# Patient Record
Sex: Female | Born: 1950 | Hispanic: No | Marital: Married | Smoking: Never smoker
Health system: Southern US, Community
[De-identification: ages and names within clinical notes are randomized; demographics above are authoritative.]

## PROBLEM LIST (undated history)

## (undated) DIAGNOSIS — E119 Type 2 diabetes mellitus without complications: Secondary | ICD-10-CM

## (undated) DIAGNOSIS — I5022 Chronic systolic (congestive) heart failure: Secondary | ICD-10-CM

## (undated) DIAGNOSIS — E079 Disorder of thyroid, unspecified: Secondary | ICD-10-CM

## (undated) DIAGNOSIS — I255 Ischemic cardiomyopathy: Secondary | ICD-10-CM

## (undated) DIAGNOSIS — E785 Hyperlipidemia, unspecified: Secondary | ICD-10-CM

## (undated) DIAGNOSIS — I2510 Atherosclerotic heart disease of native coronary artery without angina pectoris: Secondary | ICD-10-CM

## (undated) DIAGNOSIS — I10 Essential (primary) hypertension: Secondary | ICD-10-CM

## (undated) DIAGNOSIS — I219 Acute myocardial infarction, unspecified: Secondary | ICD-10-CM

## (undated) HISTORY — DX: Hyperlipidemia, unspecified: E78.5

## (undated) HISTORY — DX: Disorder of thyroid, unspecified: E07.9

## (undated) HISTORY — DX: Atherosclerotic heart disease of native coronary artery without angina pectoris: I25.10

## (undated) HISTORY — PX: CARDIAC SURGERY: SHX584

## (undated) HISTORY — DX: Chronic systolic (congestive) heart failure: I50.22

## (undated) HISTORY — PX: CATARACT EXTRACTION, BILATERAL: SHX1313

## (undated) HISTORY — PX: HIP FRACTURE SURGERY: SHX118

## (undated) HISTORY — PX: CORONARY ANGIOPLASTY WITH STENT PLACEMENT: SHX49

## (undated) HISTORY — DX: Ischemic cardiomyopathy: I25.5

---

## 2016-11-28 ENCOUNTER — Emergency Department (HOSPITAL_COMMUNITY)
Admission: EM | Admit: 2016-11-28 | Discharge: 2016-11-28 | Disposition: A | Discharge status: Home / Self Care | Payer: Self-pay | Attending: Emergency Medicine | Admitting: Emergency Medicine

## 2016-11-28 ENCOUNTER — Encounter (HOSPITAL_COMMUNITY): Payer: Self-pay | Admitting: *Deleted

## 2016-11-28 ENCOUNTER — Emergency Department (HOSPITAL_COMMUNITY): Payer: Self-pay

## 2016-11-28 DIAGNOSIS — Z955 Presence of coronary angioplasty implant and graft: Secondary | ICD-10-CM | POA: Insufficient documentation

## 2016-11-28 DIAGNOSIS — Z7902 Long term (current) use of antithrombotics/antiplatelets: Secondary | ICD-10-CM | POA: Insufficient documentation

## 2016-11-28 DIAGNOSIS — R0602 Shortness of breath: Secondary | ICD-10-CM | POA: Insufficient documentation

## 2016-11-28 DIAGNOSIS — I5020 Unspecified systolic (congestive) heart failure: Secondary | ICD-10-CM | POA: Insufficient documentation

## 2016-11-28 DIAGNOSIS — I110 Hypertensive heart disease with heart failure: Secondary | ICD-10-CM | POA: Insufficient documentation

## 2016-11-28 DIAGNOSIS — I252 Old myocardial infarction: Secondary | ICD-10-CM | POA: Insufficient documentation

## 2016-11-28 DIAGNOSIS — E119 Type 2 diabetes mellitus without complications: Secondary | ICD-10-CM | POA: Insufficient documentation

## 2016-11-28 DIAGNOSIS — Z794 Long term (current) use of insulin: Secondary | ICD-10-CM | POA: Insufficient documentation

## 2016-11-28 HISTORY — DX: Type 2 diabetes mellitus without complications: E11.9

## 2016-11-28 HISTORY — DX: Acute myocardial infarction, unspecified: I21.9

## 2016-11-28 HISTORY — DX: Essential (primary) hypertension: I10

## 2016-11-28 LAB — CBC
HCT: 31.1 % — ABNORMAL LOW (ref 36.0–46.0)
Hemoglobin: 10 g/dL — ABNORMAL LOW (ref 12.0–15.0)
MCH: 25.3 pg — AB (ref 26.0–34.0)
MCHC: 32.2 g/dL (ref 30.0–36.0)
MCV: 78.5 fL (ref 78.0–100.0)
PLATELETS: 320 10*3/uL (ref 150–400)
RBC: 3.96 MIL/uL (ref 3.87–5.11)
RDW: 17 % — AB (ref 11.5–15.5)
WBC: 9.4 10*3/uL (ref 4.0–10.5)

## 2016-11-28 LAB — BASIC METABOLIC PANEL
Anion gap: 9 (ref 5–15)
BUN: 42 mg/dL — AB (ref 6–20)
CALCIUM: 8.9 mg/dL (ref 8.9–10.3)
CHLORIDE: 103 mmol/L (ref 101–111)
CO2: 23 mmol/L (ref 22–32)
CREATININE: 1.72 mg/dL — AB (ref 0.44–1.00)
GFR calc Af Amer: 35 mL/min — ABNORMAL LOW (ref >60)
GFR calc non Af Amer: 30 mL/min — ABNORMAL LOW (ref >60)
Glucose, Bld: 138 mg/dL — ABNORMAL HIGH (ref 65–99)
Potassium: 4.8 mmol/L (ref 3.5–5.1)
Sodium: 135 mmol/L (ref 135–145)

## 2016-11-28 LAB — I-STAT TROPONIN, ED
Troponin i, poc: 0.03 ng/mL (ref 0.00–0.08)
Troponin i, poc: 0.04 ng/mL (ref 0.00–0.08)

## 2016-11-28 LAB — BRAIN NATRIURETIC PEPTIDE: B Natriuretic Peptide: 1147.8 pg/mL — ABNORMAL HIGH (ref 0.0–100.0)

## 2016-11-28 MED ORDER — FUROSEMIDE 10 MG/ML IJ SOLN
40.0000 mg | Freq: Once | INTRAMUSCULAR | Status: AC
  Administered 2016-11-28: 40 mg via INTRAVENOUS
  Filled 2016-11-28: qty 4

## 2016-11-28 MED ORDER — POTASSIUM CHLORIDE CRYS ER 20 MEQ PO TBCR: 20.0000 meq | EXTENDED_RELEASE_TABLET | Freq: Every day | ORAL | 0 refills | Status: DC

## 2016-11-28 MED ORDER — FUROSEMIDE 20 MG PO TABS: 20.0000 mg | ORAL_TABLET | Freq: Two times a day (BID) | ORAL | 0 refills | Status: DC

## 2016-11-28 NOTE — ED Triage Notes (Signed)
Per EMS patient was complaining of chest pain, light headed, and cough. Pt speaks Arabic.  EMS reports she has history of MI, Stent, ?chf.  .  CBG 163.  BP 108/59.  ASA  and Nitro 0.4mg  given by EMS, 100cc NS given.  NSL.

## 2016-11-28 NOTE — ED Provider Notes (Signed)
[facility] DEPT Provider Note   CSN: [id number] Arrival date & time: 11/28/16  1528     History   Chief Complaint Chief Complaint  Patient presents with  . Chest Pain  . Cough  . Dizziness    HPI Stephanie [name] is a 66 y.o. female.  Patient interviewed using her son, and an [employer] manager, to help with the communication.  The patient presents for evaluation of shortness of breath which is ongoing.  She came by EMS and during transport complained of chest pain, and lightheadedness.  She was treated with aspirin and sublingual nitroglycerin, x1.  The patient is here with several family members who live locally.  They state that she arrived here from [place], a country in the [place], several days ago, to seek care for ongoing shortness of breath.  She was hospitalized and had a cardiac catheterization on 11/10/16, that is described as being unchanged from prior catheterization coronary stenting of the RCA and LAD.  Family members have paperwork, regarding that visit, which is partially written in [place].  Upon questioning, the family does not believe that she has had a cardiac echo.  I do have a disc with information about the cardiac catheterization on it as well as a number of other documents of medical nature.  There is been no recent fever, chills, cough, focal weakness or paresthesia.  There are no other known modifying factors.  HPI  Past Medical History:  Diagnosis Date  . CHF (congestive heart failure) (HCC)   . Diabetes mellitus without complication (HCC)   . Hypertension   . MI (myocardial infarction) (HCC)     There are no active problems to display for this patient.   Past Surgical History:  Procedure Laterality Date  . CARDIAC SURGERY    . CORONARY ANGIOPLASTY WITH STENT PLACEMENT      OB History    No data available       Home Medications    Prior to Admission medications   Medication Sig Start Date End Date Taking? Authorizing Provider    allopurinol (ZYLOPRIM) 100 MG tablet Take 100 mg by mouth daily.   Yes [provider]  aspirin 325 MG EC tablet Take 100 mg by mouth daily.   Yes [provider]  carvedilol (COREG) 6.25 MG tablet Take 6.25 mg by mouth 2 (two) times daily with a meal.   Yes [provider]  clopidogrel (PLAVIX) 75 MG tablet Take 75 mg by mouth daily.   Yes [provider]  fluorometholone (FML) 0.1 % ophthalmic suspension Place 1 drop into both eyes 3 (three) times daily.   Yes [provider]  Insulin Glargine (LANTUS SOLOSTAR) 100 UNIT/ML Solostar Pen Inject 28 Units into the skin at bedtime.   Yes [provider]  Insulin Glulisine (APIDRA SOLOSTAR) 100 UNIT/ML Solostar Pen Inject 12-14 Units into the skin See admin instructions. 14 units in the morning before breakfast then 14 units before lunch then 12 units before evening meal   Yes [provider]  ivabradine (CORLANOR) 7.5 MG TABS tablet Take 7.5 mg by mouth 2 (two) times daily with a meal.   Yes [provider]  levothyroxine (SYNTHROID) 25 MCG tablet Take 25 mcg by mouth daily before breakfast.   Yes [provider]  rosuvastatin (CRESTOR) 20 MG tablet Take 20 mg by mouth at bedtime.   Yes [provider]  sacubitril-valsartan (ENTRESTO) 24-26 MG Take 1 tablet by mouth 2 (two) times daily.  Yes [provider]  UNABLE TO FIND Vismed (0.18% Sodium Hyaluronate): Instill 1 drop into both eyes three times a day   Yes [provider]  furosemide (LASIX) 20 MG tablet Take 1 tablet (20 mg total) by mouth 2 (two) times daily. 11/28/16   Mancel Bale, MD  potassium chloride SA (K-DUR,KLOR-CON) 20 MEQ tablet Take 1 tablet (20 mEq total) by mouth daily. 11/28/16   Mancel Bale, MD    Family History No family history on file.  Social History Social History  Substance Use Topics  . Smoking status: Never Smoker  . Smokeless tobacco: Never Used  .  Alcohol use No     Allergies   Patient has no known allergies.   Review of Systems Review of Systems  All other systems reviewed and are negative.    Physical Exam Updated Vital Signs BP (!) 96/50   Pulse (!) 54   Temp 98.6 F (37 C) (Oral)   Resp 11   Ht 5' (1.524 m)   Wt 56.7 kg (125 lb)   SpO2 97%   BMI 24.41 kg/m   Physical Exam  Constitutional: She is oriented to person, place, and time. She appears well-developed. No distress.  Appears older than stated age.  HENT:  Head: Normocephalic and atraumatic.  Eyes: Pupils are equal, round, and reactive to light. Conjunctivae and EOM are normal.  Neck: Normal range of motion and phonation normal. Neck supple.  Cardiovascular: Normal rate and regular rhythm.   Pulmonary/Chest: Effort normal and breath sounds normal. She exhibits no tenderness.  Abdominal: Soft. She exhibits no distension. There is no tenderness. There is no guarding.  Musculoskeletal: Normal range of motion. She exhibits no edema or deformity.  Neurological: She is alert and oriented to person, place, and time. She exhibits normal muscle tone.  Skin: Skin is warm and dry.  Psychiatric: She has a normal mood and affect. Her behavior is normal.  Nursing note and vitals reviewed.    ED Treatments / Results  Labs (all labs ordered are listed, but only abnormal results are displayed) Labs Reviewed  BASIC METABOLIC PANEL - Abnormal; Notable for the following:       Result Value   Glucose, Bld 138 (*)    BUN 42 (*)    Creatinine, Ser 1.72 (*)    GFR calc non Af Amer 30 (*)    GFR calc Af Amer 35 (*)    All other components within normal limits  CBC - Abnormal; Notable for the following:    Hemoglobin 10.0 (*)    HCT 31.1 (*)    MCH 25.3 (*)    RDW 17.0 (*)    All other components within normal limits  BRAIN NATRIURETIC PEPTIDE - Abnormal; Notable for the following:    B Natriuretic Peptide 1,147.8 (*)    All other components within normal limits   I-STAT TROPONIN, ED  I-STAT TROPONIN, ED    EKG  EKG Interpretation  Date/Time:  Monday November 28 2016 15:38:03 EDT Ventricular Rate:  51 PR Interval:  156 QRS Duration: 98 QT Interval:  482 QTC Calculation: 444 R Axis:   -56 Text Interpretation:  Sinus bradycardia Left axis deviation Low voltage QRS ST & T wave abnormality, consider lateral ischemia Abnormal ECG No old tracing to compare Confirmed by Mancel Bale [id number]) on 11/28/2016 7:24:25 PM       Radiology Dg Chest 2 View  Result Date: 11/28/2016 CLINICAL DATA:  Chest pain with shortness of breath  EXAM: CHEST  2 VIEW COMPARISON:  None. FINDINGS: No acute consolidation or pleural effusion. Borderline cardiomegaly. No pneumothorax. Vascular calcifications in the aorta. IMPRESSION: Borderline cardiomegaly.  No acute infiltrate or edema Electronically Signed   By: Jasmine Pang M.D.   On: 11/28/2016 17:16    Procedures Procedures (including critical care time)  Medications Ordered in ED Medications  furosemide (LASIX) injection 40 mg (40 mg Intravenous Given 11/28/16 2214)     Initial Impression / Assessment and Plan / ED Course  I have reviewed the triage vital signs and the nursing notes.  Pertinent labs & imaging results that were available during my care of the patient were reviewed by me and considered in my medical decision making (see chart for details).      Patient Vitals for the past 24 hrs:  BP Temp Temp src Pulse Resp SpO2 Height Weight  11/28/16 2300 (!) 96/50 - - (!) 54 11 97 % - -  11/28/16 2215 (!) 92/54 - - (!) 57 17 99 % - -  11/28/16 2200 (!) 93/59 - - (!) 56 11 100 % - -  11/28/16 2145 98/60 - - (!) 57 15 100 % - -  11/28/16 2028 (!) 86/54 - - (!) 54 15 100 % - -  11/28/16 1945 (!) 93/59 - - (!) 53 (!) 9 99 % - -  11/28/16 1915 98/61 - - (!) 53 12 99 % - -  11/28/16 1900 (!) 95/53 - - (!) 52 13 100 % - -  11/28/16 1818 (!) 70/45 - - - - - - -  11/28/16 1817 (!) 72/51 98.6 F (37 C) Oral  (!) 46 16 100 % - -  11/28/16 1543 (!) 100/53 98.1 F (36.7 C) Oral (!) 52 18 99 % - -  11/28/16 1536 - - - - - - 5' (1.524 m) 56.7 kg (125 lb)    11:22 PM Reevaluation with update and discussion. After initial assessment and treatment, an updated evaluation reveals no change in clinical status.  Findings discussed with patient, and son, all questions answered. Conroy Goracke L      Final Clinical Impressions(s) / ED Diagnoses   Final diagnoses:  Systolic congestive heart failure, unspecified HF chronicity (HCC)   Shortness of breath, with apparent stable cardiac status, but remaining symptomatic.  Recent change from Aldactone, to torsemide, without resolution of symptoms.  Will stop torsemide for now, and start furosemide 20 mg twice daily, with close follow-up planned by seeing a cardiologist as an outpatient in 1 or 2 weeks.  Doubt ACS, PE or pneumonia.  Nursing Notes Reviewed/ Care Coordinated Applicable Imaging Reviewed Interpretation of Laboratory Data incorporated into ED treatment  The patient appears reasonably screened and/or stabilized for discharge and I doubt any other medical condition or other [facility] requiring further screening, evaluation, or treatment in the ED at this time prior to discharge.  Plan: Home Medications-stop torsemide, start Lasix, continue other medications as usual; Home Treatments-rest, 3 meals each day; return here if the recommended treatment, does not improve the symptoms; Recommended follow up-PCP as needed.  Cardiology for checkup in 1 or 2 weeks.   New Prescriptions New Prescriptions   FUROSEMIDE (LASIX) 20 MG TABLET    Take 1 tablet (20 mg total) by mouth 2 (two) times daily.   POTASSIUM CHLORIDE SA (K-DUR,KLOR-CON) 20 MEQ TABLET    Take 1 tablet (20 mEq total) by mouth daily.     Mancel Bale, MD 11/28/16 2325

## 2016-11-28 NOTE — ED Notes (Signed)
Pt's family member stated that the pt "always feels dizzy". Informed Dr. Effie Shy.

## 2016-11-28 NOTE — ED Notes (Signed)
Pt just arrived from [place] 3 days ago, but states even in [place] she was going to the hospital every 2 weeks to tx her sob.  99% in triage, but placed on O2 for sob.

## 2016-11-28 NOTE — Discharge Instructions (Signed)
We are changing your medication, diuretic, stopping torsemide, and starting furosemide.  Make sure that you take the potassium pills along with the furosemide.  Continue taking your other medications, as prescribed.  Return here, if needed, for problems.

## 2016-12-04 ENCOUNTER — Other Ambulatory Visit: Payer: Self-pay

## 2016-12-04 ENCOUNTER — Inpatient Hospital Stay
Admission: EM | Admit: 2016-12-04 | Discharge: 2016-12-06 | DRG: 291 | Disposition: A | Discharge status: Home / Self Care | Payer: Self-pay | Attending: Internal Medicine | Admitting: Internal Medicine

## 2016-12-04 ENCOUNTER — Emergency Department: Payer: Self-pay

## 2016-12-04 ENCOUNTER — Inpatient Hospital Stay
Admit: 2016-12-04 | Discharge: 2016-12-04 | Disposition: A | Discharge status: Home / Self Care | Payer: Self-pay | Attending: Cardiology | Admitting: Cardiology

## 2016-12-04 DIAGNOSIS — R079 Chest pain, unspecified: Secondary | ICD-10-CM

## 2016-12-04 DIAGNOSIS — N183 Chronic kidney disease, stage 3 (moderate): Secondary | ICD-10-CM | POA: Diagnosis present

## 2016-12-04 DIAGNOSIS — E1122 Type 2 diabetes mellitus with diabetic chronic kidney disease: Secondary | ICD-10-CM | POA: Diagnosis present

## 2016-12-04 DIAGNOSIS — I509 Heart failure, unspecified: Secondary | ICD-10-CM

## 2016-12-04 DIAGNOSIS — I130 Hypertensive heart and chronic kidney disease with heart failure and stage 1 through stage 4 chronic kidney disease, or unspecified chronic kidney disease: Principal | ICD-10-CM | POA: Diagnosis present

## 2016-12-04 DIAGNOSIS — I5023 Acute on chronic systolic (congestive) heart failure: Secondary | ICD-10-CM

## 2016-12-04 DIAGNOSIS — Z7902 Long term (current) use of antithrombotics/antiplatelets: Secondary | ICD-10-CM

## 2016-12-04 DIAGNOSIS — I959 Hypotension, unspecified: Secondary | ICD-10-CM | POA: Diagnosis present

## 2016-12-04 DIAGNOSIS — I255 Ischemic cardiomyopathy: Secondary | ICD-10-CM | POA: Diagnosis present

## 2016-12-04 DIAGNOSIS — Z794 Long term (current) use of insulin: Secondary | ICD-10-CM

## 2016-12-04 DIAGNOSIS — E039 Hypothyroidism, unspecified: Secondary | ICD-10-CM | POA: Diagnosis present

## 2016-12-04 DIAGNOSIS — Z955 Presence of coronary angioplasty implant and graft: Secondary | ICD-10-CM

## 2016-12-04 DIAGNOSIS — J9601 Acute respiratory failure with hypoxia: Secondary | ICD-10-CM | POA: Diagnosis present

## 2016-12-04 DIAGNOSIS — Z23 Encounter for immunization: Secondary | ICD-10-CM

## 2016-12-04 DIAGNOSIS — I2510 Atherosclerotic heart disease of native coronary artery without angina pectoris: Secondary | ICD-10-CM | POA: Diagnosis present

## 2016-12-04 DIAGNOSIS — I252 Old myocardial infarction: Secondary | ICD-10-CM

## 2016-12-04 DIAGNOSIS — Z8249 Family history of ischemic heart disease and other diseases of the circulatory system: Secondary | ICD-10-CM

## 2016-12-04 DIAGNOSIS — M109 Gout, unspecified: Secondary | ICD-10-CM | POA: Diagnosis present

## 2016-12-04 DIAGNOSIS — E785 Hyperlipidemia, unspecified: Secondary | ICD-10-CM | POA: Diagnosis present

## 2016-12-04 DIAGNOSIS — Z7982 Long term (current) use of aspirin: Secondary | ICD-10-CM

## 2016-12-04 DIAGNOSIS — R0902 Hypoxemia: Secondary | ICD-10-CM

## 2016-12-04 DIAGNOSIS — R0602 Shortness of breath: Secondary | ICD-10-CM

## 2016-12-04 LAB — TROPONIN I: Troponin I: 0.04 ng/mL (ref <0.03)

## 2016-12-04 LAB — COMPREHENSIVE METABOLIC PANEL
ALBUMIN: 4 g/dL (ref 3.5–5.0)
ALT: 33 U/L (ref 14–54)
AST: 33 U/L (ref 15–41)
Alkaline Phosphatase: 81 U/L (ref 38–126)
Anion gap: 10 (ref 5–15)
BUN: 43 mg/dL — ABNORMAL HIGH (ref 6–20)
CHLORIDE: 107 mmol/L (ref 101–111)
CO2: 22 mmol/L (ref 22–32)
CREATININE: 1.47 mg/dL — AB (ref 0.44–1.00)
Calcium: 9.1 mg/dL (ref 8.9–10.3)
GFR calc non Af Amer: 37 mL/min — ABNORMAL LOW (ref >60)
GFR, EST AFRICAN AMERICAN: 43 mL/min — AB (ref >60)
Glucose, Bld: 179 mg/dL — ABNORMAL HIGH (ref 65–99)
Potassium: 4 mmol/L (ref 3.5–5.1)
SODIUM: 139 mmol/L (ref 135–145)
Total Bilirubin: 0.6 mg/dL (ref 0.3–1.2)
Total Protein: 6.9 g/dL (ref 6.5–8.1)

## 2016-12-04 LAB — TSH: TSH: 3.604 u[IU]/mL (ref 0.350–4.500)

## 2016-12-04 LAB — CBC
HCT: 34.9 % — ABNORMAL LOW (ref 35.0–47.0)
Hemoglobin: 11.5 g/dL — ABNORMAL LOW (ref 12.0–16.0)
MCH: 26.4 pg (ref 26.0–34.0)
MCHC: 32.9 g/dL (ref 32.0–36.0)
MCV: 80.2 fL (ref 80.0–100.0)
PLATELETS: 254 10*3/uL (ref 150–440)
RBC: 4.36 MIL/uL (ref 3.80–5.20)
RDW: 18.1 % — ABNORMAL HIGH (ref 11.5–14.5)
WBC: 11.2 10*3/uL — AB (ref 3.6–11.0)

## 2016-12-04 LAB — GLUCOSE, CAPILLARY
Glucose-Capillary: 164 mg/dL — ABNORMAL HIGH (ref 65–99)
Glucose-Capillary: 144 mg/dL — ABNORMAL HIGH (ref 65–99)
Glucose-Capillary: 363 mg/dL — ABNORMAL HIGH (ref 65–99)
Glucose-Capillary: 140 mg/dL — ABNORMAL HIGH (ref 65–99)
Glucose-Capillary: 197 mg/dL — ABNORMAL HIGH (ref 65–99)

## 2016-12-04 LAB — BRAIN NATRIURETIC PEPTIDE: B NATRIURETIC PEPTIDE 5: 2062 pg/mL — AB (ref 0.0–100.0)

## 2016-12-04 LAB — HEMOGLOBIN A1C
Hgb A1c MFr Bld: 7.7 % — ABNORMAL HIGH (ref 4.8–5.6)
Mean Plasma Glucose: 174.29 mg/dL

## 2016-12-04 MED ORDER — HEPARIN SODIUM (PORCINE) 5000 UNIT/ML IJ SOLN
5000.0000 [IU] | Freq: Three times a day (TID) | INTRAMUSCULAR | Status: DC
  Filled 2016-12-04 (×3): qty 1

## 2016-12-04 MED ORDER — HYDROCORTISONE 1 % EX CREA
TOPICAL_CREAM | Freq: Three times a day (TID) | CUTANEOUS | Status: DC | PRN
  Administered 2016-12-04: 23:00:00 via TOPICAL
  Filled 2016-12-04: qty 28

## 2016-12-04 MED ORDER — ASPIRIN EC 81 MG PO TBEC
81.0000 mg | DELAYED_RELEASE_TABLET | Freq: Every day | ORAL | Status: DC
  Administered 2016-12-04 – 2016-12-06 (×3): 81 mg via ORAL
  Filled 2016-12-04 (×3): qty 1

## 2016-12-04 MED ORDER — ONDANSETRON HCL 4 MG PO TABS: 4.0000 mg | ORAL_TABLET | Freq: Four times a day (QID) | ORAL | Status: DC | PRN

## 2016-12-04 MED ORDER — BUMETANIDE 0.25 MG/ML IJ SOLN
1.0000 mg | Freq: Once | INTRAMUSCULAR | Status: AC
  Administered 2016-12-04: 1 mg via INTRAVENOUS
  Filled 2016-12-04: qty 4

## 2016-12-04 MED ORDER — INFLUENZA VAC SPLIT QUAD 0.5 ML IM SUSY
0.5000 mL | PREFILLED_SYRINGE | INTRAMUSCULAR | Status: AC
  Administered 2016-12-06: 0.5 mL via INTRAMUSCULAR
  Filled 2016-12-04: qty 0.5

## 2016-12-04 MED ORDER — ROSUVASTATIN CALCIUM 10 MG PO TABS
20.0000 mg | ORAL_TABLET | Freq: Every day | ORAL | Status: DC
  Administered 2016-12-04 – 2016-12-05 (×2): 20 mg via ORAL
  Filled 2016-12-04 (×2): qty 2

## 2016-12-04 MED ORDER — CLOPIDOGREL BISULFATE 75 MG PO TABS
75.0000 mg | ORAL_TABLET | Freq: Every day | ORAL | Status: DC
  Administered 2016-12-04 – 2016-12-06 (×3): 75 mg via ORAL
  Filled 2016-12-04 (×3): qty 1

## 2016-12-04 MED ORDER — SACUBITRIL-VALSARTAN 24-26 MG PO TABS
1.0000 | ORAL_TABLET | Freq: Two times a day (BID) | ORAL | Status: DC
  Administered 2016-12-04: 1 via ORAL
  Filled 2016-12-04: qty 1

## 2016-12-04 MED ORDER — CARVEDILOL 6.25 MG PO TABS
6.2500 mg | ORAL_TABLET | Freq: Two times a day (BID) | ORAL | Status: DC
  Administered 2016-12-04: 6.25 mg via ORAL
  Filled 2016-12-04: qty 1

## 2016-12-04 MED ORDER — LEVOTHYROXINE SODIUM 25 MCG PO TABS
25.0000 ug | ORAL_TABLET | Freq: Every day | ORAL | Status: DC
  Administered 2016-12-04 – 2016-12-06 (×3): 25 ug via ORAL
  Filled 2016-12-04 (×3): qty 1

## 2016-12-04 MED ORDER — INSULIN GLARGINE 100 UNIT/ML ~~LOC~~ SOLN
16.0000 [IU] | Freq: Every day | SUBCUTANEOUS | Status: DC
  Administered 2016-12-04 – 2016-12-05 (×2): 16 [IU] via SUBCUTANEOUS
  Filled 2016-12-04 (×3): qty 0.16

## 2016-12-04 MED ORDER — DIPHENHYDRAMINE-ZINC ACETATE 2-0.1 % EX CREA
TOPICAL_CREAM | Freq: Three times a day (TID) | CUTANEOUS | Status: DC | PRN
  Filled 2016-12-04: qty 28

## 2016-12-04 MED ORDER — DOCUSATE SODIUM 100 MG PO CAPS
100.0000 mg | ORAL_CAPSULE | Freq: Two times a day (BID) | ORAL | Status: DC
  Administered 2016-12-04 – 2016-12-06 (×5): 100 mg via ORAL
  Filled 2016-12-04 (×5): qty 1

## 2016-12-04 MED ORDER — INSULIN ASPART 100 UNIT/ML ~~LOC~~ SOLN
0.0000 [IU] | Freq: Three times a day (TID) | SUBCUTANEOUS | Status: DC
  Administered 2016-12-04: 15 [IU] via SUBCUTANEOUS
  Administered 2016-12-04 (×2): 2 [IU] via SUBCUTANEOUS
  Administered 2016-12-05 (×2): 8 [IU] via SUBCUTANEOUS
  Administered 2016-12-05: 3 [IU] via SUBCUTANEOUS
  Administered 2016-12-06: 8 [IU] via SUBCUTANEOUS
  Administered 2016-12-06: 3 [IU] via SUBCUTANEOUS
  Filled 2016-12-04 (×8): qty 1

## 2016-12-04 MED ORDER — FUROSEMIDE 20 MG PO TABS
20.0000 mg | ORAL_TABLET | Freq: Two times a day (BID) | ORAL | Status: DC
  Administered 2016-12-04 – 2016-12-06 (×4): 20 mg via ORAL
  Filled 2016-12-04 (×4): qty 1

## 2016-12-04 MED ORDER — ONDANSETRON HCL 4 MG/2ML IJ SOLN: 4.0000 mg | Freq: Four times a day (QID) | INTRAMUSCULAR | Status: DC | PRN

## 2016-12-04 MED ORDER — POTASSIUM CHLORIDE CRYS ER 20 MEQ PO TBCR
20.0000 meq | EXTENDED_RELEASE_TABLET | Freq: Every day | ORAL | Status: DC
  Administered 2016-12-04 – 2016-12-06 (×3): 20 meq via ORAL
  Filled 2016-12-04 (×3): qty 1

## 2016-12-04 MED ORDER — ALLOPURINOL 100 MG PO TABS
100.0000 mg | ORAL_TABLET | Freq: Every day | ORAL | Status: DC
  Administered 2016-12-04 – 2016-12-06 (×3): 100 mg via ORAL
  Filled 2016-12-04 (×3): qty 1

## 2016-12-04 MED ORDER — ASPIRIN 325 MG PO TBEC: 100.0000 mg | DELAYED_RELEASE_TABLET | Freq: Every day | ORAL | Status: DC

## 2016-12-04 MED ORDER — PNEUMOCOCCAL VAC POLYVALENT 25 MCG/0.5ML IJ INJ
0.5000 mL | INJECTION | INTRAMUSCULAR | Status: AC
Start: 2016-12-05 — End: 2016-12-06
  Administered 2016-12-06: 0.5 mL via INTRAMUSCULAR
  Filled 2016-12-04: qty 0.5

## 2016-12-04 MED ORDER — FLUOROMETHOLONE 0.1 % OP SUSP
1.0000 [drp] | Freq: Three times a day (TID) | OPHTHALMIC | Status: DC
  Administered 2016-12-04 – 2016-12-06 (×5): 1 [drp] via OPHTHALMIC
  Filled 2016-12-04 (×2): qty 5

## 2016-12-04 MED ORDER — ACETAMINOPHEN 325 MG PO TABS: 650.0000 mg | ORAL_TABLET | Freq: Four times a day (QID) | ORAL | Status: DC | PRN

## 2016-12-04 MED ORDER — IVABRADINE HCL 7.5 MG PO TABS
7.5000 mg | ORAL_TABLET | Freq: Two times a day (BID) | ORAL | Status: DC
  Administered 2016-12-04: 7.5 mg via ORAL
  Filled 2016-12-04 (×2): qty 1

## 2016-12-04 MED ORDER — ACETAMINOPHEN 650 MG RE SUPP: 650.0000 mg | Freq: Four times a day (QID) | RECTAL | Status: DC | PRN

## 2016-12-04 MED ORDER — PERFLUTREN LIPID MICROSPHERE
1.0000 mL | INTRAVENOUS | Status: AC | PRN
  Administered 2016-12-04: 4 mL via INTRAVENOUS
  Filled 2016-12-04: qty 10

## 2016-12-04 NOTE — Progress Notes (Signed)
Pt arrived from ED with family at bedside. Pt is AxO. No c/o pain, no SOB. Per family pt speaks no english. Pt will need a translator, per family pt speaks arabic. Telemetry and skin verified. No concerns offered at this time.

## 2016-12-04 NOTE — ED Triage Notes (Signed)
Pt brought in by [facility] from home.  Pt had recent STEMI 6 months ago, and has recently had fluid on her lungs.  Pt presents with dyspnea at rest and tachypnea.  Pt O2 per fire was 86%.   Pt 88% on RA upon arrival, pt placed on 2LNC.  Pt unable to speak [place], family on way.

## 2016-12-04 NOTE — ED Notes (Signed)
Patient assisted to commode in room without incident. Patient denies need for anything else at this time. Family remains at bedside.

## 2016-12-04 NOTE — Plan of Care (Signed)
Problem: Cardiac: Goal: Ability to achieve and maintain adequate cardiopulmonary perfusion will improve Outcome: Not Progressing B.P. running very low today (70's & 80's). Informed physician. Several medications discontinued. Will continue to monitor. Jari Favre [facility]

## 2016-12-04 NOTE — Consult Note (Signed)
[facility]  CARDIOLOGY CONSULT NOTE  Patient ID: Stephanie [name] MRN: [id number] DOB/AGE: 03/28/1950 66 y.o.  Admit date: 12/04/2016 Referring Physician Dr. Elpidio Anis Primary Physician   Primary Cardiologist   Reason for Consultation chf  HPI: Pt is 66 yo from [place] with apparent history of cad s/p stemi 6 months ago in [place] treated with pci of lad and rcat reated with dual antiplatelet therapy, history of chf with a reported ef of 25% on entresto, carvedilol, ivabradine and lasix. She apparently had chf post mi. She is on crestor for hyperlipidemia. She presented to the er on 10/1 with complaints of sob which was treated medically as cxr did not show significant chf. Per report, she underwent cardiac cath 11/10/16 which revealed patent stents of rca and lad. She returned to the er early this am with more sob. Had sats at 86% per ems. BNP was 2062 and troponin of 0.04 with creatinine of 1.47. Pt speaks no [place]. Via video interpreter, pt states she was being considered for a "device to help her heart" in [place]. SHe would like this done in th [place] if possible. EKG shows nsr with qrs duration of on 109.   Review of Systems  Constitutional: Negative.   HENT: Negative.   Eyes: Negative.   Respiratory: Positive for shortness of breath.   Cardiovascular: Negative.   Gastrointestinal: Negative.   Genitourinary: Negative.   Musculoskeletal: Negative.   Skin: Negative.   Neurological: Negative.   Endo/Heme/Allergies: Negative.   Psychiatric/Behavioral: The patient is nervous/anxious.     Past Medical History:  Diagnosis Date  . CHF (congestive heart failure) (HCC)   . Diabetes mellitus without complication (HCC)   . Hypertension   . MI (myocardial infarction) (HCC)     Family History  Problem Relation Age of Onset  . Hypertension Other     Social History   Social History  . Marital status: Divorced    Spouse name: N/A  . Number of  children: N/A  . Years of education: N/A   Occupational History  . Not on file.   Social History Main Topics  . Smoking status: Never Smoker  . Smokeless tobacco: Never Used  . Alcohol use No  . Drug use: No  . Sexual activity: Not on file   Other Topics Concern  . Not on file   Social History Narrative  . No narrative on file    Past Surgical History:  Procedure Laterality Date  . CARDIAC SURGERY    . CORONARY ANGIOPLASTY WITH STENT PLACEMENT       Prescriptions Prior to Admission  Medication Sig Dispense Refill Last Dose  . allopurinol (ZYLOPRIM) 100 MG tablet Take 100 mg by mouth daily.   11/28/2016 at am  . aspirin 325 MG EC tablet Take 100 mg by mouth daily.   11/28/2016 at 1000  . carvedilol (COREG) 6.25 MG tablet Take 6.25 mg by mouth 2 (two) times daily with a meal.   11/28/2016 at 1000  . clopidogrel (PLAVIX) 75 MG tablet Take 75 mg by mouth daily.   11/27/2016 at 1200  . fluorometholone (FML) 0.1 % ophthalmic suspension Place 1 drop into both eyes 3 (three) times daily.   11/28/2016 at am  . furosemide (LASIX) 20 MG tablet Take 1 tablet (20 mg total) by mouth 2 (two) times daily. 60 tablet 0   . Insulin Glargine (LANTUS SOLOSTAR) 100 UNIT/ML Solostar Pen Inject 28 Units into the skin  at bedtime.   11/27/2016 at pm  . Insulin Glulisine (APIDRA SOLOSTAR) 100 UNIT/ML Solostar Pen Inject 12-14 Units into the skin See admin instructions. 14 units in the morning before breakfast then 14 units before lunch then 12 units before evening meal   11/28/2016 at am  . ivabradine (CORLANOR) 7.5 MG TABS tablet Take 7.5 mg by mouth 2 (two) times daily with a meal.   11/28/2016 at am  . levothyroxine (SYNTHROID) 25 MCG tablet Take 25 mcg by mouth daily before breakfast.   11/28/2016 at am  . potassium chloride SA (K-DUR,KLOR-CON) 20 MEQ tablet Take 1 tablet (20 mEq total) by mouth daily. 30 tablet 0   . rosuvastatin (CRESTOR) 20 MG tablet Take 20 mg by mouth at bedtime.   11/27/2016 at pmallop    . sacubitril-valsartan (ENTRESTO) 24-26 MG Take 1 tablet by mouth 2 (two) times daily.   11/28/2016 at 1000  . UNABLE TO FIND Vismed (0.18% Sodium Hyaluronate): Instill 1 drop into both eyes three times a day   11/28/2016 at am    Physical Exam: Blood pressure (!) 98/57, pulse 64, temperature (!) 97.5 F (36.4 C), temperature source Oral, resp. rate 16, height 5' (1.524 m), weight 56.8 kg (125 lb 4.8 oz), SpO2 97 %.   Wt Readings from Last 1 Encounters:  12/04/16 56.8 kg (125 lb 4.8 oz)     General appearance: alert and cooperative Head: Normocephalic, without obvious abnormality, atraumatic Resp: clear to auscultation bilaterally Cardio: regular rate and rhythm GI: soft, non-tender; bowel sounds normal; no masses,  no organomegaly Extremities: extremities normal, atraumatic, no cyanosis or edema Neurologic: Grossly normal  Labs:   Lab Results  Component Value Date   WBC 11.2 (H) 12/04/2016   HGB 11.5 (L) 12/04/2016   HCT 34.9 (L) 12/04/2016   MCV 80.2 12/04/2016   PLT 254 12/04/2016    Recent Labs Lab 12/04/16 0015  NA 139  K 4.0  CL 107  CO2 22  BUN 43*  CREATININE 1.47*  CALCIUM 9.1  PROT 6.9  BILITOT 0.6  ALKPHOS 81  ALT 33  AST 33  GLUCOSE 179*   Lab Results  Component Value Date   TROPONINI 0.04 (HH) 12/04/2016      Radiology: Peribronchial thickening with basilar opacities. No significant pleural effusion.  EKG: NSR with intermittent bradycardia. No ischemia  ASSESSMENT AND PLAN:  Patient is a 66 year old [place] femalewho recently came to the [place] for a prolonged visit. She was treated for an apparent ST elevation myocardial infarction approximately 6 months ago in [place] and appears to have received a stent in her LAD as well as the RCA. She appears to have had an ischemic cardiomyopathy with an ejection fraction less than 30%. She has been treated with Inetta Fermo anti-anginal and heart failure regimen including dual antiplatelet therapy with  aspirin and Plavix,and on ivabradine, entresto, carvedilol and lasix. She is on crestor. She complains of sob. CXR shows possible mild fluid in the fissures but no frank chf. WIll consider aicd for primary prevention as she is post stemi of 6 monthes with reported ef less than 30. Will order echo to document. Not an apparent candidate for BiV pacing as the qrs duration is short.  Signed: Dalia Heading MD, [facility] 12/04/2016, 8:48 AM

## 2016-12-04 NOTE — Progress Notes (Signed)
*  PRELIMINARY RESULTS* Echocardiogram 2D Echocardiogram has been performed. Definity IV Contrast used on this exam.  Stephanie [name] Stephanie [name] 12/04/2016, 1:47 PM

## 2016-12-04 NOTE — Progress Notes (Signed)
Patient's most recent B.P. = only 81 systolic on a retake. Per Dr. Elpidio Anis, continue to monitor B.P. as usual until further notice. Patient asymptomatic. Will continue to monitor. Stephanie [name] [facility]

## 2016-12-04 NOTE — ED Notes (Addendum)
Date and time results received: 12/04/16 0051  Test: Troponin Critical Value: 0.04  Name of Provider Notified: Dolores Frame  Orders Received? Or Actions Taken?: None

## 2016-12-04 NOTE — ED Notes (Signed)
Patient assisted to commode. Patient denies need for anything at this time.

## 2016-12-04 NOTE — H&P (Signed)
Stephanie [name] is an 66 y.o. female.   Chief Complaint: Shortness of breath HPI: The patient with past medical history of CHF, hypertension and diabetes presents emergency department complaining of shortness of breath. The patient has had similar episodes in the past following myocardial infarction. She is status post PCI. History is provided by her brother and son as the patient speaks Arabic. Her family is concerned that she has recurrent episodes of shortness of breath. They report lower extremity swelling over the last few days and significant tachypnea. The patient appears fatigued but does not have complaints at this time now that she is received Bumex. She is not euvolemic, thus the emergency department staff called the hospitalist service for admission.  Past Medical History:  Diagnosis Date  . CHF (congestive heart failure) ([place])   . Diabetes mellitus without complication ([place])   . Hypertension   . MI (myocardial infarction) [facility])     Past Surgical History:  Procedure Laterality Date  . CARDIAC SURGERY    . CORONARY ANGIOPLASTY WITH STENT PLACEMENT      Family History  Problem Relation Age of Onset  . Hypertension Other    Social History:  reports that she has never smoked. She has never used smokeless tobacco. She reports that she does not drink alcohol or use drugs.  Allergies: No Known Allergies  Medications Prior to Admission  Medication Sig Dispense Refill  . allopurinol (ZYLOPRIM) 100 MG tablet Take 100 mg by mouth daily.    Marland Kitchen aspirin 325 MG EC tablet Take 100 mg by mouth daily.    . carvedilol (COREG) 6.25 MG tablet Take 6.25 mg by mouth 2 (two) times daily with a meal.    . clopidogrel (PLAVIX) 75 MG tablet Take 75 mg by mouth daily.    . fluorometholone (FML) 0.1 % ophthalmic suspension Place 1 drop into both eyes 3 (three) times daily.    . furosemide (LASIX) 20 MG tablet Take 1 tablet (20 mg total) by mouth 2 (two) times daily. 60 tablet 0  . Insulin Glargine  (LANTUS SOLOSTAR) 100 UNIT/ML Solostar Pen Inject 28 Units into the skin at bedtime.    . Insulin Glulisine (APIDRA SOLOSTAR) 100 UNIT/ML Solostar Pen Inject 12-14 Units into the skin See admin instructions. 14 units in the morning before breakfast then 14 units before lunch then 12 units before evening meal    . ivabradine (CORLANOR) 7.5 MG TABS tablet Take 7.5 mg by mouth 2 (two) times daily with a meal.    . levothyroxine (SYNTHROID) 25 MCG tablet Take 25 mcg by mouth daily before breakfast.    . potassium chloride SA (K-DUR,KLOR-CON) 20 MEQ tablet Take 1 tablet (20 mEq total) by mouth daily. 30 tablet 0  . rosuvastatin (CRESTOR) 20 MG tablet Take 20 mg by mouth at bedtime.    . sacubitril-valsartan (ENTRESTO) 24-26 MG Take 1 tablet by mouth 2 (two) times daily.    Marland Kitchen UNABLE TO FIND Vismed (0.18% Sodium Hyaluronate): Instill 1 drop into both eyes three times a day      Results for orders placed or performed during the hospital encounter of 12/04/16 (from the past 48 hour(s))  CBC     Status: Abnormal   Collection Time: 12/04/16 12:15 AM  Result Value Ref Range   WBC 11.2 (H) 3.6 - 11.0 K/uL   RBC 4.36 3.80 - 5.20 MIL/uL   Hemoglobin 11.5 (L) 12.0 - 16.0 g/dL   HCT 34.9 (L) 35.0 - 47.0 %  MCV 80.2 80.0 - 100.0 fL   MCH 26.4 26.0 - 34.0 pg   MCHC 32.9 32.0 - 36.0 g/dL   RDW 18.1 (H) 11.5 - 14.5 %   Platelets 254 150 - 440 K/uL  Comprehensive metabolic panel     Status: Abnormal   Collection Time: 12/04/16 12:15 AM  Result Value Ref Range   Sodium 139 135 - 145 mmol/L   Potassium 4.0 3.5 - 5.1 mmol/L   Chloride 107 101 - 111 mmol/L   CO2 22 22 - 32 mmol/L   Glucose, Bld 179 (H) 65 - 99 mg/dL   BUN 43 (H) 6 - 20 mg/dL   Creatinine, Ser 1.47 (H) 0.44 - 1.00 mg/dL   Calcium 9.1 8.9 - 10.3 mg/dL   Total Protein 6.9 6.5 - 8.1 g/dL   Albumin 4.0 3.5 - 5.0 g/dL   AST 33 15 - 41 U/L   ALT 33 14 - 54 U/L   Alkaline Phosphatase 81 38 - 126 U/L   Total Bilirubin 0.6 0.3 - 1.2 mg/dL   GFR  calc non Af Amer 37 (L) >60 mL/min   GFR calc Af Amer 43 (L) >60 mL/min    Comment: (NOTE) The eGFR has been calculated using the CKD EPI equation. This calculation has not been validated in all clinical situations. eGFR's persistently <60 mL/min signify possible Chronic Kidney Disease.    Anion gap 10 5 - 15  Troponin I     Status: Abnormal   Collection Time: 12/04/16 12:15 AM  Result Value Ref Range   Troponin I 0.04 (HH) <0.03 ng/mL    Comment: CRITICAL RESULT CALLED TO, READ BACK BY AND VERIFIED WITH DAVID WALKER AT 5027 12/04/16.PMH  Brain natriuretic peptide     Status: Abnormal   Collection Time: 12/04/16 12:15 AM  Result Value Ref Range   B Natriuretic Peptide 2,062.0 (H) 0.0 - 100.0 pg/mL  TSH     Status: None   Collection Time: 12/04/16 12:15 AM  Result Value Ref Range   TSH 3.604 0.350 - 4.500 uIU/mL    Comment: Performed by a 3rd Generation assay with a functional sensitivity of <=0.01 uIU/mL.  Glucose, capillary     Status: Abnormal   Collection Time: 12/04/16  4:21 AM  Result Value Ref Range   Glucose-Capillary 164 (H) 65 - 99 mg/dL   Dg Chest 2 View  Result Date: 12/04/2016 CLINICAL DATA:  Shortness of breath EXAM: CHEST  2 VIEW COMPARISON:  11/28/2016 FINDINGS: Heart size and pulmonary vascularity are normal for technique. Peribronchial thickening with streaky perihilar and basilar opacities new since previous study. This likely represents bronchitis. No focal consolidation or airspace disease. No blunting of costophrenic angles. Small amount of fluid in the fissures. No pneumothorax. Degenerative changes in the spine and shoulders. Calcification of the aorta. Coronary artery stents. IMPRESSION: Developing peribronchial and perihilar streaky opacities suggesting bronchitis. Small amount of fluid in the fissures. No focal consolidation. Aortic atherosclerosis. Electronically Signed   By: Lucienne Capers M.D.   On: 12/04/2016 00:54    Review of Systems  Constitutional:  Positive for malaise/fatigue. Negative for chills and fever.  HENT: Negative for sore throat and tinnitus.   Eyes: Negative for blurred vision and redness.  Respiratory: Positive for shortness of breath. Negative for cough.   Cardiovascular: Negative for chest pain, palpitations, orthopnea and PND.  Gastrointestinal: Negative for abdominal pain, diarrhea, nausea and vomiting.  Genitourinary: Negative for dysuria, frequency and urgency.  Musculoskeletal: Negative for joint  pain and myalgias.  Skin: Negative for rash.       No lesions  Neurological: Negative for speech change, focal weakness and weakness.  Endo/Heme/Allergies: Does not bruise/bleed easily.       No temperature intolerance  Psychiatric/Behavioral: Negative for depression and suicidal ideas.    Blood pressure (!) 98/57, pulse 64, temperature (!) 97.5 F (36.4 C), temperature source Oral, resp. rate 16, height 5' (1.524 m), weight 56.8 kg (125 lb 4.8 oz), SpO2 97 %. Physical Exam  Vitals reviewed. Constitutional: She is oriented to person, place, and time. She appears well-developed and well-nourished. No distress.  HENT:  Head: Normocephalic and atraumatic.  Mouth/Throat: Oropharynx is clear and moist.  Eyes: Pupils are equal, round, and reactive to light. Conjunctivae and EOM are normal. No scleral icterus.  Neck: Normal range of motion. Neck supple. No JVD present. No tracheal deviation present. No thyromegaly present.  Cardiovascular: Normal rate, regular rhythm and normal heart sounds.  Exam reveals no gallop and no friction rub.   No murmur heard. Respiratory: Effort normal and breath sounds normal.  GI: Soft. Bowel sounds are normal. She exhibits no distension. There is no tenderness.  Genitourinary:  Genitourinary Comments: Deferred  Musculoskeletal: Normal range of motion. She exhibits edema (trace).  Lymphadenopathy:    She has no cervical adenopathy.  Neurological: She is alert and oriented to person, place,  and time. No cranial nerve deficit. She exhibits normal muscle tone.  Skin: Skin is warm and dry. No rash noted. No erythema.  Psychiatric: She has a normal mood and affect. Her behavior is normal. Judgment and thought content normal.     Assessment/Plan This is a 66 year old female admitted for CHF exacerbation. 1. CHF: Acute on chronic; systolic. I reviewed the patient's outside records shows LVEF of 25%. She also has a cath report which shows stenting post myocardial infarction. The patient has received a dose of Bumex in the emergency department we will continue diuretic therapy until patient is euvolemic. Continue Entresto. The patient also has a recent x-ray film that I have reviewed which does not indicate any anatomical abnormalities and also demonstrates clear (albeit hyperinflated) lung fields. 2. CAD: Stable; continue aspirin and Plavix 3. Hypertension: Controlled; continue carvedilol and Ivabradine.  4. CKD: stage III; monitor urine output as the patient's son states that she has not been urinating much lately. She has an improved creatinine from previous labs thus I will hold off on nephrology consult for now.  5. Diabetes mellitus type 2: Continue basal insulin adjusted for hospital diet. Sliding suicidal hospitalized. 6. Hyperlipidemia: Continue statin therapy 7. Hypothyroidism: Check TSH; continue Synthroid 8. Gout: Stable; continue allopurinol 9. DVT prophylaxis: Upper and 10. GI prophylaxis: None The patient is a full code. Time spent on admission orders and patient care approximately 45 minutes  Harrie Foreman, MD 12/04/2016, 6:26 AM

## 2016-12-04 NOTE — ED Provider Notes (Signed)
[facility] Emergency Department Provider Note   ____________________________________________   First MD Initiated Contact with Patient 12/04/16 0034     (approximate)  I have reviewed the triage vital signs and the nursing notes.   HISTORY  Chief Complaint Shortness of Breath  history obtained via patient's son who translates for her  HPI Stephanie [name] is a 65 y.o. female brought to the ED via EMS from home with a chief complaint of shortness of breath. Symptoms ongoing for the past 1-2 days. Son reports patient with fluid on her lungs after STEMI 6 months ago. Takes Aldactone and torsemide. Symptoms associated with chest tightness. Room air saturations were 86% per the fire department. Denies recent fever, chills, abdominal pain, nausea, vomiting. Denies recent travel or trauma. Oxygen makes her symptoms better; exertion makes her symptoms worse.   Past Medical History:  Diagnosis Date  . CHF (congestive heart failure) ([facility])   . Diabetes mellitus without complication ([facility])   . Hypertension   . MI (myocardial infarction) ([facility])     There are no active problems to display for this patient.   Past Surgical History:  Procedure Laterality Date  . CARDIAC SURGERY    . CORONARY ANGIOPLASTY WITH STENT PLACEMENT      Prior to Admission medications   Medication Sig Start Date End Date Taking? Authorizing Provider  allopurinol (ZYLOPRIM) 100 MG tablet Take 100 mg by mouth daily.   Yes [provider]  aspirin 325 MG EC tablet Take 100 mg by mouth daily.   Yes [provider]  carvedilol (COREG) 6.25 MG tablet Take 6.25 mg by mouth 2 (two) times daily with a meal.   Yes [provider]  clopidogrel (PLAVIX) 75 MG tablet Take 75 mg by mouth daily.   Yes [provider]  fluorometholone (FML) 0.1 % ophthalmic suspension Place 1 drop into both eyes 3 (three) times daily.   Yes [provider]  furosemide (LASIX) 20  MG tablet Take 1 tablet (20 mg total) by mouth 2 (two) times daily. 11/28/16  Yes Mancel Bale, MD  Insulin Glargine (LANTUS SOLOSTAR) 100 UNIT/ML Solostar Pen Inject 28 Units into the skin at bedtime.   Yes [provider]  Insulin Glulisine (APIDRA SOLOSTAR) 100 UNIT/ML Solostar Pen Inject 12-14 Units into the skin See admin instructions. 14 units in the morning before breakfast then 14 units before lunch then 12 units before evening meal   Yes [provider]  ivabradine (CORLANOR) 7.5 MG TABS tablet Take 7.5 mg by mouth 2 (two) times daily with a meal.   Yes [provider]  levothyroxine (SYNTHROID) 25 MCG tablet Take 25 mcg by mouth daily before breakfast.   Yes [provider]  potassium chloride SA (K-DUR,KLOR-CON) 20 MEQ tablet Take 1 tablet (20 mEq total) by mouth daily. 11/28/16  Yes Mancel Bale, MD  rosuvastatin (CRESTOR) 20 MG tablet Take 20 mg by mouth at bedtime.   Yes [provider]  sacubitril-valsartan (ENTRESTO) 24-26 MG Take 1 tablet by mouth 2 (two) times daily.   Yes [provider]  UNABLE TO FIND Vismed (0.18% Sodium Hyaluronate): Instill 1 drop into both eyes three times a day    [provider]    Allergies Patient has no known allergies.  No family history on file.  Social History Social History  Substance Use Topics  . Smoking status: Never Smoker  . Smokeless tobacco: Never Used  . Alcohol use No  Review of Systems  Constitutional: No fever/chills. Eyes: No visual changes. ENT: No sore throat. Cardiovascular: positive for chest pain. Respiratory: positive for shortness of breath. Gastrointestinal: No abdominal pain.  No nausea, no vomiting.  No diarrhea.  No constipation. Genitourinary: Negative for dysuria. Musculoskeletal: Negative for back pain. Skin: Negative for rash. Neurological: Negative for headaches, focal weakness or  numbness.   ____________________________________________   PHYSICAL EXAM:  VITAL SIGNS: ED Triage Vitals  Enc Vitals Group     BP 12/04/16 0015 111/77     Pulse Rate 12/04/16 0015 86     Resp 12/04/16 0015 (!) 24     Temp 12/04/16 0015 98.1 F (36.7 C)     Temp Source 12/04/16 0015 Oral     SpO2 12/04/16 0015 (!) 88 %     Weight 12/04/16 0017 125 lb (56.7 kg)     Height 12/04/16 0017 5' (1.524 m)     Head Circumference --      Peak Flow --      Pain Score --      Pain Loc --      Pain Edu? --      Excl. in GC? --     Constitutional: Alert and oriented. Uncomfortable appearing and in mild acute distress. Eyes: Conjunctivae are normal. PERRL. EOMI. Head: Atraumatic. Nose: No congestion/rhinnorhea. Mouth/Throat: Mucous membranes are moist.  Oropharynx non-erythematous. Neck: No stridor.   Cardiovascular: Normal rate, regular rhythm. Grossly normal heart sounds.  Good peripheral circulation. Respiratory: Increased respiratory effort.  No retractions. Lungs with rales diffusely. Gastrointestinal: Soft and nontender. No distention. No abdominal bruits. No CVA tenderness. Musculoskeletal: No lower extremity tenderness. 1+ nonpitting BLE edema.  No joint effusions. Neurologic:  Normal speech and language. No gross focal neurologic deficits are appreciated.  Skin:  Skin is warm, dry and intact. No rash noted. Psychiatric: Mood and affect are normal. Speech and behavior are normal.  ____________________________________________   LABS (all labs ordered are listed, but only abnormal results are displayed)  Labs Reviewed  CBC - Abnormal; Notable for the following:       Result Value   WBC 11.2 (*)    Hemoglobin 11.5 (*)    HCT 34.9 (*)    RDW 18.1 (*)    All other components within normal limits  COMPREHENSIVE METABOLIC PANEL - Abnormal; Notable for the following:    Glucose, Bld 179 (*)    BUN 43 (*)    Creatinine, Ser 1.47 (*)    GFR calc non Af Amer 37 (*)    GFR  calc Af Amer 43 (*)    All other components within normal limits  TROPONIN I - Abnormal; Notable for the following:    Troponin I 0.04 (*)    All other components within normal limits  BRAIN NATRIURETIC PEPTIDE - Abnormal; Notable for the following:    B Natriuretic Peptide 2,062.0 (*)    All other components within normal limits   ____________________________________________  EKG  ED ECG REPORT I, Any Mcneice J, the attending physician, personally viewed and interpreted this ECG.   Date: 12/04/2016  EKG Time: 0020  Rate: 74  Rhythm: normal EKG, normal sinus rhythm  Axis: LAD  Intervals:left anterior fascicular block  ST&T Change: nonspecific  ____________________________________________  RADIOLOGY  Dg Chest 2 View  Result Date: 12/04/2016 CLINICAL DATA:  Shortness of breath EXAM: CHEST  2 VIEW COMPARISON:  11/28/2016 FINDINGS: Heart size and pulmonary vascularity are normal for technique. Peribronchial thickening with streaky perihilar  and basilar opacities new since previous study. This likely represents bronchitis. No focal consolidation or airspace disease. No blunting of costophrenic angles. Small amount of fluid in the fissures. No pneumothorax. Degenerative changes in the spine and shoulders. Calcification of the aorta. Coronary artery stents. IMPRESSION: Developing peribronchial and perihilar streaky opacities suggesting bronchitis. Small amount of fluid in the fissures. No focal consolidation. Aortic atherosclerosis. Electronically Signed   By: Burman Nieves M.D.   On: 12/04/2016 00:54    ____________________________________________   PROCEDURES  Procedure(s) performed: None  Procedures  Critical Care performed: Yes, see critical care note(s)   CRITICAL CARE Performed by: Irean Hong   Total critical care time: 30 minutes  Critical care time was exclusive of separately billable procedures and treating other patients.  Critical care was necessary to treat  or prevent imminent or life-threatening deterioration.  Critical care was time spent personally by me on the following activities: development of treatment plan with patient and/or surrogate as well as nursing, discussions with consultants, evaluation of patient's response to treatment, examination of patient, obtaining history from patient or surrogate, ordering and performing treatments and interventions, ordering and review of laboratory studies, ordering and review of radiographic studies, pulse oximetry and re-evaluation of patient's condition.  ____________________________________________   INITIAL IMPRESSION / ASSESSMENT AND PLAN / ED COURSE    66 year old female with a history of MI, hypertension, diabetes and CHF who presents with shortness of breath and chest tightness. Differential includes, but is not limited to, viral syndrome, bronchitis including COPD exacerbation, pneumonia, reactive airway disease including asthma, CHF including exacerbation with or without pulmonary/interstitial edema, pneumothorax, ACS, thoracic trauma, and pulmonary embolism. Will obtain screening lab work, chest x-ray; administer IV Bumex and reassess. Anticipate hospitalization.  Clinical Course as of Dec 05 110  Wynelle Link Dec 04, 2016  0111 Updated patient and her family members of laboratory and imaging results. We'll discuss with hospitalist to evaluate patient in the emergency department for admission.  [JS]    Clinical Course User Index [JS] Irean Hong, MD     ____________________________________________   FINAL CLINICAL IMPRESSION(S) / ED DIAGNOSES  Final diagnoses:  SOB (shortness of breath)  Acute on chronic congestive heart failure, unspecified heart failure type (HCC)  Hypoxia  Chest pain, unspecified type      NEW MEDICATIONS STARTED DURING THIS VISIT:  New Prescriptions   No medications on file     Note:  This document was prepared using Dragon voice recognition software and  may include unintentional dictation errors.    Irean Hong, MD 12/04/16 [phone]

## 2016-12-05 DIAGNOSIS — I5023 Acute on chronic systolic (congestive) heart failure: Secondary | ICD-10-CM

## 2016-12-05 LAB — GLUCOSE, CAPILLARY
Glucose-Capillary: 174 mg/dL — ABNORMAL HIGH (ref 65–99)
Glucose-Capillary: 281 mg/dL — ABNORMAL HIGH (ref 65–99)
Glucose-Capillary: 295 mg/dL — ABNORMAL HIGH (ref 65–99)
Glucose-Capillary: 134 mg/dL — ABNORMAL HIGH (ref 65–99)

## 2016-12-05 LAB — ECHOCARDIOGRAM COMPLETE
Height: 60 in
Weight: 2004.8 oz

## 2016-12-05 MED ORDER — DIGOXIN 250 MCG PO TABS
0.2500 mg | ORAL_TABLET | Freq: Once | ORAL | Status: AC
  Administered 2016-12-05: 0.25 mg via ORAL
  Filled 2016-12-05 (×2): qty 1

## 2016-12-05 MED ORDER — SODIUM CHLORIDE 0.9 % IV BOLUS (SEPSIS)
250.0000 mL | Freq: Once | INTRAVENOUS | Status: AC
  Administered 2016-12-05: 250 mL via INTRAVENOUS

## 2016-12-05 MED ORDER — FUROSEMIDE 20 MG PO TABS: 40.0000 mg | ORAL_TABLET | Freq: Two times a day (BID) | ORAL | 0 refills | Status: DC

## 2016-12-05 MED ORDER — DIGOXIN 125 MCG PO TABS
0.1250 mg | ORAL_TABLET | Freq: Every day | ORAL | Status: DC
  Administered 2016-12-06: 0.125 mg via ORAL
  Filled 2016-12-05: qty 1

## 2016-12-05 NOTE — Progress Notes (Signed)
Plan is to ambulate patient on room air and check saturations. Likely discharge later today with oxygen.

## 2016-12-05 NOTE — Progress Notes (Signed)
Progress Note  Patient Name: Stephanie [name] Date of Encounter: 12/05/2016  Primary Cardiologist: New  Subjective   The patient was seen by Dr. Lady Gary who asked me to see the patient given language barrier. The patient speaks Arabic. I reviewed her history and some of her medical records from [place]. She has known history of diabetes mellitus and hyperlipidemia who presented about 6 months ago with late presenting myocardial infarction with severe LV systolic dysfunction. She had PCI of the LAD and right coronary artery. In spite of that, her ejection fraction remained poor with ejection fraction of 25%. She had cardiac catheterization done in [place] in September . The report was reviewed and it indicated patent stents in the LAD and right coronary artery. EF was 25%. The patient came from [place] about 10 days ago and she has been having recurrent episodes of volume overload. Her blood pressure has been running low which makes it difficult to titrate her heart failure medications.   Inpatient Medications    Scheduled Meds: . allopurinol  100 mg Oral Daily  . aspirin EC  81 mg Oral Daily  . clopidogrel  75 mg Oral Daily  . [START ON 12/06/2016] digoxin  0.125 mg Oral Daily  . digoxin  0.25 mg Oral Once  . docusate sodium  100 mg Oral BID  . fluorometholone  1 drop Both Eyes TID  . furosemide  20 mg Oral BID  . heparin  5,000 Units Subcutaneous Q8H  . Influenza vac split quadrivalent PF  0.5 mL Intramuscular Tomorrow-1000  . insulin aspart  0-15 Units Subcutaneous TID WC  . insulin glargine  16 Units Subcutaneous QHS  . levothyroxine  25 mcg Oral QAC breakfast  . pneumococcal 23 valent vaccine  0.5 mL Intramuscular Tomorrow-1000  . potassium chloride SA  20 mEq Oral Daily  . rosuvastatin  20 mg Oral QHS   Continuous Infusions:  PRN Meds: acetaminophen **OR** acetaminophen, hydrocortisone cream, ondansetron **OR** ondansetron (ZOFRAN) IV   Vital Signs    Vitals:   12/05/16 0353  12/05/16 0631 12/05/16 0754 12/05/16 1204  BP: (!) 72/31 (!) 94/52 (!) 93/47 (!) 84/45  Pulse: 66 70 68 69  Resp: 18     Temp: 97.7 F (36.5 C)   98.2 F (36.8 C)  TempSrc:    Oral  SpO2: 100%  100% 100%  Weight: 127 lb 1.6 oz (57.7 kg)     Height:        Intake/Output Summary (Last 24 hours) at 12/05/16 1837 Last data filed at 12/05/16 1337  Gross per 24 hour  Intake              360 ml  Output             1100 ml  Net             -740 ml   Filed Weights   12/04/16 0017 12/04/16 0446 12/05/16 0353  Weight: 125 lb (56.7 kg) 125 lb 4.8 oz (56.8 kg) 127 lb 1.6 oz (57.7 kg)    Telemetry    Normal sinus rhythm with anteroseptal infarct. QRS duration is 109 ms- Personally Reviewed  ECG     Physical Exam   GEN: No acute distress.   Neck: No JVD Cardiac: RRR, no murmurs, rubs, or gallops.  Respiratory: Clear to auscultation bilaterally. GI: Soft, nontender, non-distended  MS: No edema; No deformity. Neuro:  Nonfocal  Psych: Normal affect   Labs    Chemistry Recent Labs  Lab 12/04/16 0015  NA 139  K 4.0  CL 107  CO2 22  GLUCOSE 179*  BUN 43*  CREATININE 1.47*  CALCIUM 9.1  PROT 6.9  ALBUMIN 4.0  AST 33  ALT 33  ALKPHOS 81  BILITOT 0.6  GFRNONAA 37*  GFRAA 43*  ANIONGAP 10     Hematology Recent Labs Lab 12/04/16 0015  WBC 11.2*  RBC 4.36  HGB 11.5*  HCT 34.9*  MCV 80.2  MCH 26.4  MCHC 32.9  RDW 18.1*  PLT 254    Cardiac Enzymes Recent Labs Lab 12/04/16 0015  TROPONINI 0.04*    Recent Labs Lab 11/28/16 2008  TROPIPOC 0.04     BNP Recent Labs Lab 12/04/16 0015  BNP 2,062.0*     DDimer No results for input(s): DDIMER in the last 168 hours.   Radiology    Dg Chest 2 View  Result Date: 12/04/2016 CLINICAL DATA:  Shortness of breath EXAM: CHEST  2 VIEW COMPARISON:  11/28/2016 FINDINGS: Heart size and pulmonary vascularity are normal for technique. Peribronchial thickening with streaky perihilar and basilar opacities new since  previous study. This likely represents bronchitis. No focal consolidation or airspace disease. No blunting of costophrenic angles. Small amount of fluid in the fissures. No pneumothorax. Degenerative changes in the spine and shoulders. Calcification of the aorta. Coronary artery stents. IMPRESSION: Developing peribronchial and perihilar streaky opacities suggesting bronchitis. Small amount of fluid in the fissures. No focal consolidation. Aortic atherosclerosis. Electronically Signed   By: Burman Nieves M.D.   On: 12/04/2016 00:54    Cardiac Studies   Echocardiogram: : EF 20-25% with akinesis of the anteroseptal myocardium.   Patient Profile     66 y.o. female with known history of diabetes mellitus, chronic systolic heart failure due to ischemic cardiomyopathy and coronary artery disease. She comes with recurrent acute on chronic systolic heart failure.  Assessment & Plan    1. Acute on chronic systolic heart failure: Most of her symptoms seem to be related to low cardiac output. Her lungs are relatively clear and I recommend continuing current dose of furosemide 20 mg by mouth twice daily. She has not been able to tolerate heart failure medications due to low blood pressure which indicates poor prognosis. QRS is not wide enough and thus she would not benefit from CRT. Given her recurrent presentations and low blood pressure, her best option is probably LVAD. However, she unfortunately does not have health insurance in the [place]. I'm going to continue same dose furosemide. I added digoxin. Once blood pressure improves, we can consider adding small dose carvedilol and losartan but this probably would have to be done in the outpatient setting.  The patient will need to follow-up with me within one week after hospital discharge.   For questions or updates, please contact [facility] Please consult [email or web address] for contact info under Cardiology/STEMI.      Signed, Lorine Bears, MD  12/05/2016, 6:37 PM

## 2016-12-05 NOTE — Progress Notes (Addendum)
Advance care planning  Discussed with patient and husband at bedside through interpreter [place] with ID #[id number].  Discussed regarding patient's chronic systolic congestive heart failure with ejection fraction of 20%. They want to explore options regarding a chip which I assume is an AICD and heart transplant. They are also adamant that her blood pressure needs to be kept under 100 systolic. I tried to discussed regarding blood pressure but they're not willing to listen to the explanation. Explained to them that she will likely have chronic mild pulmonary edema and also be fatigued and short of breath due to poor ejection fraction. Advised that in the acute care setting we need to treat pulmonary edema and have her follow-up at tertiary care center for further workup regarding heart transplant. She is very frustrated because of recurrent episodes of pulmonary edema. She has had prior thoracentesis with her doctors in [place] telling her that her shortness of breath and congestive heart failure cannot be fixed. We discussed regarding medical management with Lasix. I advised that cardiology would be further discussing regarding advanced heart failure options.  Time spent 20 minutes

## 2016-12-05 NOTE — Progress Notes (Signed)
First AM SBP in 70s. Asymptomatic. Patient sleeping. Rechecked SBP in 90s. MD notified. Acknowledged. Ordered for bolus. Educated son on plan of care. Verbalized understanding.

## 2016-12-05 NOTE — Progress Notes (Signed)
Discussed patients care with her and medications she is taking using the interpreter on wheels. AZAD [id number]

## 2016-12-05 NOTE — Progress Notes (Signed)
[facility] - [place] at [facility]   PATIENT NAME: Stephanie [name]    MR#:  [id number]  DATE OF BIRTH:  04-15-1950  SUBJECTIVE:  CHIEF COMPLAINT:   Chief Complaint  Patient presents with  . Shortness of Breath   SOB better. On 2 L O2  Husband at bedside.  REVIEW OF SYSTEMS:    Review of Systems  Constitutional: Positive for malaise/fatigue. Negative for chills and fever.  HENT: Negative for sore throat.   Eyes: Negative for blurred vision, double vision and pain.  Respiratory: Positive for shortness of breath. Negative for cough, hemoptysis and wheezing.   Cardiovascular: Negative for chest pain, palpitations, orthopnea and leg swelling.  Gastrointestinal: Negative for abdominal pain, constipation, diarrhea, heartburn, nausea and vomiting.  Genitourinary: Negative for dysuria and hematuria.  Musculoskeletal: Negative for back pain and joint pain.  Skin: Negative for rash.  Neurological: Positive for weakness. Negative for sensory change, speech change, focal weakness and headaches.  Endo/Heme/Allergies: Does not bruise/bleed easily.  Psychiatric/Behavioral: Negative for depression. The patient is not nervous/anxious.     DRUG ALLERGIES:  No Known Allergies  VITALS:  Blood pressure (!) 84/45, pulse 69, temperature 98.2 F (36.8 C), temperature source Oral, resp. rate 18, height 5' (1.524 m), weight 57.7 kg (127 lb 1.6 oz), SpO2 100 %.  PHYSICAL EXAMINATION:   Physical Exam  GENERAL:  66 y.o.-year-old patient lying in the bed with no acute distress.  EYES: Pupils equal, round, reactive to light and accommodation. No scleral icterus. Extraocular muscles intact.  HEENT: Head atraumatic, normocephalic. Oropharynx and nasopharynx clear.  NECK:  Supple, no jugular venous distention. No thyroid enlargement, no tenderness.  LUNGS: Normal breath sounds bilaterally, no wheezing, rales, rhonchi. No use of accessory muscles of respiration.  CARDIOVASCULAR: S1, S2  normal. No murmurs, rubs, or gallops.  ABDOMEN: Soft, nontender, nondistended. Bowel sounds present. No organomegaly or mass.  EXTREMITIES: No cyanosis, clubbing or edema b/l.    NEUROLOGIC: Cranial nerves II through XII are intact. No focal Motor or sensory deficits b/l.   PSYCHIATRIC: The patient is alert and oriented x 3.  SKIN: No obvious rash, lesion, or ulcer.   LABORATORY PANEL:   CBC  Recent Labs Lab 12/04/16 0015  WBC 11.2*  HGB 11.5*  HCT 34.9*  PLT 254   ------------------------------------------------------------------------------------------------------------------ Chemistries   Recent Labs Lab 12/04/16 0015  NA 139  K 4.0  CL 107  CO2 22  GLUCOSE 179*  BUN 43*  CREATININE 1.47*  CALCIUM 9.1  AST 33  ALT 33  ALKPHOS 81  BILITOT 0.6   ------------------------------------------------------------------------------------------------------------------  Cardiac Enzymes  Recent Labs Lab 12/04/16 0015  TROPONINI 0.04*   ------------------------------------------------------------------------------------------------------------------  RADIOLOGY:  Dg Chest 2 View  Result Date: 12/04/2016 CLINICAL DATA:  Shortness of breath EXAM: CHEST  2 VIEW COMPARISON:  11/28/2016 FINDINGS: Heart size and pulmonary vascularity are normal for technique. Peribronchial thickening with streaky perihilar and basilar opacities new since previous study. This likely represents bronchitis. No focal consolidation or airspace disease. No blunting of costophrenic angles. Small amount of fluid in the fissures. No pneumothorax. Degenerative changes in the spine and shoulders. Calcification of the aorta. Coronary artery stents. IMPRESSION: Developing peribronchial and perihilar streaky opacities suggesting bronchitis. Small amount of fluid in the fissures. No focal consolidation. Aortic atherosclerosis. Electronically Signed   By: Burman Nieves M.D.   On: 12/04/2016 00:54      ASSESSMENT AND PLAN:   This is a 66 year old female admitted for CHF exacerbation  *  Acute on chronic systolic chf On lasix Had to hold coreg/entresto/ivabradine due to hypotension  * CAD: Stable; continue aspirin and Plavix  * CKD: stage 3 stable  * Diabetes mellitus type 2 Continue basal insulin and ssi  * Hyperlipidemia Continue statin therapy  * Hypothyroidism Normal TSH. On levothyroxine  All the records are reviewed and case discussed with Care Management/Social Worker Management plans discussed with the patient, family and they are in agreement.  CODE STATUS: FULL CODE  DVT Prophylaxis: SCDs  TOTAL TIME TAKING CARE OF THIS PATIENT: 45 minutes.   POSSIBLE D/C IN 1-2 DAYS, DEPENDING ON CLINICAL CONDITION.  Milagros Loll R M.D on 12/05/2016 at 1:01 PM  Between 7am to 6pm - Pager - [phone]  After 6pm go to [email or web address] - password [email or web address]  [facility] [place] [facility]  Office  [phone]  CC: Primary care physician; [facility], [place] [facility]  Note: This dictation was prepared with Dragon dictation along with smaller phrase technology. Any transcriptional errors that result from this process are unintentional.

## 2016-12-05 NOTE — Progress Notes (Signed)
Spoke with patient and family via [place] interpreter  Pt with probable ischemic cardiomyopathy. Is on aggressive therapy. Very hypotensive. Family is anxious about her blood pressure going above 100. Will attempt to keep sbp around 100. Has been taken off of entresto and ivabradine and carvedilol due to blood pressure. Not candidate for CRT but will need advanced chf evaluation and discussion. Due to language barrier for me, have asked Dr. Kirke Corin to help with her care.

## 2016-12-06 LAB — GLUCOSE, CAPILLARY
Glucose-Capillary: 163 mg/dL — ABNORMAL HIGH (ref 65–99)
Glucose-Capillary: 257 mg/dL — ABNORMAL HIGH (ref 65–99)

## 2016-12-06 MED ORDER — DIGOXIN 125 MCG PO TABS: 0.1250 mg | ORAL_TABLET | Freq: Every day | ORAL | 0 refills | Status: DC

## 2016-12-06 MED ORDER — FUROSEMIDE 20 MG PO TABS: 20.0000 mg | ORAL_TABLET | Freq: Two times a day (BID) | ORAL | 0 refills | Status: DC

## 2016-12-06 NOTE — Progress Notes (Signed)
Dr. Elpidio Anis in to see pt with use of video interpretor. Pt will be discharged today.

## 2016-12-06 NOTE — Care Management (Signed)
Patient for discharge home today with family.  She will follow up with Dr Kirke Corin.  Has been taken off Entresto due to low blood pressure until is seen by Dr Kirke Corin.  No discharge needs identified by members of the care team.

## 2016-12-06 NOTE — Progress Notes (Signed)
Spoke with patient and who I presume is her daughter via in person interpreter. Reviewed heart failure medications (lasix and digoxin). Educated patient to take lasix twice a day, one first thing in the morning and the second one around 3pm to prevent her having to use the bathroom at night. Explained that this medication will make her go to the bathroom more because it is helping to remove fluid from the body. Will help her breath more easily. Daughter asked if patient should still be taking some other diuretic medications- showed me boxes of spironolactone and toresimide (obviously from outside the [place]). Told them not to continue those at this time. Patient also educated about digoxin-take once daily. Educated about side effects of vision changes, arrythmia, nausea. Patient and daughter expressed concern, stating patient was on in the past but was stopped because of weakness. Expressed patient concern to Dr. Kirke Corin- via Dr. Okey Dupre- Dr Keturah Barre stated to continue with digoxin therapy and he will follow up with patient and draw level in 1 week in office. Message relayed to patient and daughter-they stated ok  Olene Floss, Pharm.D, BCPS Clinical Pharmacist

## 2016-12-06 NOTE — Progress Notes (Signed)
Reviewed discharge instructions and education with patient and her daughter with the use of an interpreter. Pt and family state that they understand the instructions and will follow up with Dr. Kirke Corin next week. IV & heart monitor removed. Pt taken down via wheelchair.

## 2016-12-06 NOTE — Progress Notes (Signed)
Assessment and medication administration done using stratus video interpretor. Pt has no complaints today and states she is feeling much better.

## 2016-12-07 NOTE — Discharge Summary (Signed)
[facility] - [place] at [facility]   PATIENT NAME: Stephanie [name]    MR#:  [id number]  DATE OF BIRTH:  1950-06-23  DATE OF ADMISSION:  12/04/2016 ADMITTING PHYSICIAN: Arnaldo Natal, MD  DATE OF DISCHARGE: 12/06/2016  3:00 PM  PRIMARY CARE PHYSICIAN: Care, [place] [facility]   ADMISSION DIAGNOSIS:  SOB (shortness of breath) [R06.02] Hypoxia [R09.02] Chest pain, unspecified type [R07.9] Acute on chronic congestive heart failure, unspecified heart failure type (HCC) [I50.9]  DISCHARGE DIAGNOSIS:  Active Problems:   Acute on chronic systolic heart failure (HCC)   SECONDARY DIAGNOSIS:   Past Medical History:  Diagnosis Date  . CHF (congestive heart failure) (HCC)   . Diabetes mellitus without complication (HCC)   . Hypertension   . MI (myocardial infarction) [facility])      ADMITTING HISTORY  Chief Complaint: Shortness of breath HPI: The patient with past medical history of CHF, hypertension and diabetes presents emergency department complaining of shortness of breath. The patient has had similar episodes in the past following myocardial infarction. She is status post PCI. History is provided by her brother and son as the patient speaks Arabic. Her family is concerned that she has recurrent episodes of shortness of breath. They report lower extremity swelling over the last few days and significant tachypnea. The patient appears fatigued but does not have complaints at this time now that she is received Bumex. She is not euvolemic, thus the emergency department staff called the hospitalist service for admission.  HOSPITAL COURSE:   * Acute on chronic systolic congestive heart failure with ejection fraction 25% * CAD with PCI 6 months back * CKD stage III * Diabetes mellitus type 2 * Hyperlipidemia * Hypothyroidism * Acute hypoxic respiratory failure. Resolved.  Patient was admitted to telemetry floor for diuresis. She was placed on telemetry monitoring. Her  medical records from [place] were reviewed carefully. Ejection fraction was 25%. Cardiology consulted for further input regarding the treatment. Initially seen by Dr. Lady Gary and later Transitioned to Dr. Kirke Corin as patient was Arabic speaking. Patient's Entresto and corlanor were held due to hypotension. Could not diuresis the patient aggressively due to hypotension. She was on oral Lasix and diuresed well. Her acute hypoxic respiratory failure resolved. She felt back to normal. Patient is being discharged home with Lasix, aspirin, Plavix, statin, Coreg, digoxin. She will follow-up with Dr. Kirke Corin in 1 week.  [facility] physicians during hospital stay were held through interpreter and anaerobic. Answered all questions.  CONSULTS OBTAINED:  Treatment Team:  Iran Ouch, MD  DRUG ALLERGIES:  No Known Allergies  DISCHARGE MEDICATIONS:   Discharge Medication List as of 12/06/2016  1:34 PM    START taking these medications   Details  digoxin (LANOXIN) 0.125 MG tablet Take 1 tablet (0.125 mg total) by mouth daily., Starting Wed 12/07/2016, Print      CONTINUE these medications which have CHANGED   Details  furosemide (LASIX) 20 MG tablet Take 1 tablet (20 mg total) by mouth 2 (two) times daily., Starting Tue 12/06/2016, No Print      CONTINUE these medications which have NOT CHANGED   Details  allopurinol (ZYLOPRIM) 100 MG tablet Take 100 mg by mouth daily., Historical Med    aspirin 325 MG EC tablet Take 100 mg by mouth daily., Historical Med    carvedilol (COREG) 6.25 MG tablet Take 6.25 mg by mouth 2 (two) times daily with a meal., Historical Med    clopidogrel (PLAVIX) 75 MG tablet Take 75  mg by mouth daily., Historical Med    fluorometholone (FML) 0.1 % ophthalmic suspension Place 1 drop into both eyes 3 (three) times daily., Historical Med    Insulin Glargine (LANTUS SOLOSTAR) 100 UNIT/ML Solostar Pen Inject 28 Units into the skin at bedtime., Historical Med    Insulin Glulisine  (APIDRA SOLOSTAR) 100 UNIT/ML Solostar Pen Inject 12-14 Units into the skin See admin instructions. 14 units in the morning before breakfast then 14 units before lunch then 12 units before evening meal, Historical Med    levothyroxine (SYNTHROID) 25 MCG tablet Take 25 mcg by mouth daily before breakfast., Historical Med    potassium chloride SA (K-DUR,KLOR-CON) 20 MEQ tablet Take 1 tablet (20 mEq total) by mouth daily., Starting Mon 11/28/2016, Print    rosuvastatin (CRESTOR) 20 MG tablet Take 20 mg by mouth at bedtime., Historical Med    UNABLE TO FIND Vismed (0.18% Sodium Hyaluronate): Instill 1 drop into both eyes three times a day, Historical Med      STOP taking these medications     ivabradine (CORLANOR) 7.5 MG TABS tablet      sacubitril-valsartan (ENTRESTO) 24-26 MG         Today   VITAL SIGNS:  Blood pressure 133/69, pulse (!) 110, temperature 98 F (36.7 C), resp. rate 18, height 5' (1.524 m), weight 57.7 kg (127 lb 1.6 oz), SpO2 97 %.  I/O:   Intake/Output Summary (Last 24 hours) at 12/07/16 1442 Last data filed at 12/06/16 1443  Gross per 24 hour  Intake              120 ml  Output                0 ml  Net              120 ml    PHYSICAL EXAMINATION:  Physical Exam  GENERAL:  66 y.o.-year-old patient lying in the bed with no acute distress.  LUNGS: Normal breath sounds bilaterally, no wheezing, rales,rhonchi or crepitation. No use of accessory muscles of respiration.  CARDIOVASCULAR: S1, S2 normal. No murmurs, rubs, or gallops.  ABDOMEN: Soft, non-tender, non-distended. Bowel sounds present. No organomegaly or mass.  NEUROLOGIC: Moves all 4 extremities. PSYCHIATRIC: The patient is alert and oriented x 3.  SKIN: No obvious rash, lesion, or ulcer.   DATA REVIEW:   CBC  Recent Labs Lab 12/04/16 0015  WBC 11.2*  HGB 11.5*  HCT 34.9*  PLT 254    Chemistries   Recent Labs Lab 12/04/16 0015  NA 139  K 4.0  CL 107  CO2 22  GLUCOSE 179*  BUN 43*   CREATININE 1.47*  CALCIUM 9.1  AST 33  ALT 33  ALKPHOS 81  BILITOT 0.6    Cardiac Enzymes  Recent Labs Lab 12/04/16 0015  TROPONINI 0.04*    Microbiology Results  No results found for this or any previous visit.  RADIOLOGY:  No results found.  Follow up with PCP in 1 week.  Management plans discussed with the patient, family and they are in agreement.  CODE STATUS:  Code Status History    Date Active Date Inactive Code Status Order ID Comments User Context   12/04/2016  4:04 AM 12/06/2016  7:17 PM Full Code [id number]  Arnaldo Natal, MD ED      TOTAL TIME TAKING CARE OF THIS PATIENT ON DAY OF DISCHARGE: more than 30 minutes.   Milagros Loll R M.D on 12/07/2016 at 2:42 PM  Between 7am to 6pm - Pager - [phone]  After 6pm go to [email or web address] - password [email or web address]  [facility] [place] [facility]  Office  [phone]  CC: Primary care physician; [facility], [place] [facility]  Note: This dictation was prepared with Dragon dictation along with smaller phrase technology. Any transcriptional errors that result from this process are unintentional.

## 2016-12-08 ENCOUNTER — Telehealth: Payer: Self-pay | Admitting: Cardiovascular Disease

## 2016-12-08 NOTE — Telephone Encounter (Signed)
Received: 2 days ago  Message Contents  Iran Ouch, MD  Shon Baton, RN        She is being discharged home today from [facility]. Schedule a follow-up with me in 10 days.    Called to schedule new patient appointment. No answer, no voice mail is set up. Will call again.

## 2016-12-08 NOTE — Telephone Encounter (Signed)
Called patient. No answer, no voicemail set up  

## 2016-12-09 NOTE — Telephone Encounter (Signed)
Message to Dr. Kirke Corin to clarify if pt can f/u w/APP

## 2016-12-12 NOTE — Telephone Encounter (Signed)
Attempted to contact patient using [employer] Interpreter [phone] regarding need to OV. Per Dr. Kirke Corin, ok to schedule 10/18 at 10:20am. No answer, no voice mail set up on pt's phone. Emergency contact number is the same. Will call again.

## 2016-12-13 NOTE — Telephone Encounter (Signed)
Attempted to contact patient using [place]pre[employer] [phone] regarding need for an OV. No answer, no voice mail is set up. Will try and send a letter to home address.

## 2016-12-14 ENCOUNTER — Encounter: Payer: Self-pay | Admitting: Emergency Medicine

## 2016-12-14 ENCOUNTER — Emergency Department: Payer: Self-pay

## 2016-12-14 ENCOUNTER — Emergency Department
Admission: EM | Admit: 2016-12-14 | Discharge: 2016-12-14 | Disposition: A | Discharge status: Home / Self Care | Payer: Self-pay | Attending: Emergency Medicine | Admitting: Emergency Medicine

## 2016-12-14 ENCOUNTER — Ambulatory Visit: Payer: Self-pay | Admitting: Internal Medicine

## 2016-12-14 DIAGNOSIS — Z8673 Personal history of transient ischemic attack (TIA), and cerebral infarction without residual deficits: Secondary | ICD-10-CM | POA: Insufficient documentation

## 2016-12-14 DIAGNOSIS — E119 Type 2 diabetes mellitus without complications: Secondary | ICD-10-CM | POA: Insufficient documentation

## 2016-12-14 DIAGNOSIS — I110 Hypertensive heart disease with heart failure: Secondary | ICD-10-CM | POA: Insufficient documentation

## 2016-12-14 DIAGNOSIS — I959 Hypotension, unspecified: Secondary | ICD-10-CM | POA: Insufficient documentation

## 2016-12-14 DIAGNOSIS — R001 Bradycardia, unspecified: Secondary | ICD-10-CM | POA: Insufficient documentation

## 2016-12-14 DIAGNOSIS — I509 Heart failure, unspecified: Secondary | ICD-10-CM | POA: Insufficient documentation

## 2016-12-14 LAB — BASIC METABOLIC PANEL
ANION GAP: 11 (ref 5–15)
BUN: 33 mg/dL — ABNORMAL HIGH (ref 6–20)
CALCIUM: 8.8 mg/dL — AB (ref 8.9–10.3)
CHLORIDE: 102 mmol/L (ref 101–111)
CO2: 26 mmol/L (ref 22–32)
Creatinine, Ser: 1.07 mg/dL — ABNORMAL HIGH (ref 0.44–1.00)
GFR calc Af Amer: >60 mL/min (ref >60)
GFR calc non Af Amer: 54 mL/min — ABNORMAL LOW (ref >60)
Glucose, Bld: 171 mg/dL — ABNORMAL HIGH (ref 65–99)
POTASSIUM: 4.3 mmol/L (ref 3.5–5.1)
Sodium: 139 mmol/L (ref 135–145)

## 2016-12-14 LAB — CBC
HEMATOCRIT: 33.3 % — AB (ref 35.0–47.0)
HEMOGLOBIN: 11.1 g/dL — AB (ref 12.0–16.0)
MCH: 26.6 pg (ref 26.0–34.0)
MCHC: 33.2 g/dL (ref 32.0–36.0)
MCV: 80.1 fL (ref 80.0–100.0)
Platelets: 255 10*3/uL (ref 150–440)
RBC: 4.16 MIL/uL (ref 3.80–5.20)
RDW: 16.7 % — ABNORMAL HIGH (ref 11.5–14.5)
WBC: 10.8 10*3/uL (ref 3.6–11.0)

## 2016-12-14 LAB — INFLUENZA PANEL BY PCR (TYPE A & B)
INFLAPCR: NEGATIVE
Influenza B By PCR: NEGATIVE

## 2016-12-14 LAB — BRAIN NATRIURETIC PEPTIDE: B Natriuretic Peptide: 1769 pg/mL — ABNORMAL HIGH (ref 0.0–100.0)

## 2016-12-14 LAB — TROPONIN I: TROPONIN I: 0.03 ng/mL — AB (ref <0.03)

## 2016-12-14 MED ORDER — FUROSEMIDE 10 MG/ML IJ SOLN
40.0000 mg | Freq: Once | INTRAMUSCULAR | Status: AC
  Administered 2016-12-14: 40 mg via INTRAVENOUS
  Filled 2016-12-14: qty 4

## 2016-12-14 NOTE — ED Notes (Signed)
Called [facility] to verify patient's cardiology appointment on 10/23

## 2016-12-14 NOTE — ED Provider Notes (Signed)
[facility] Emergency Department Provider Note  ____________________________________________  Time seen: Approximately 1:17 PM  I have reviewed Stephanie triage vital signs and Stephanie nursing notes.   HISTORY  Chief Complaint Shortness of Breath  History may be limited due to language barrier; Stephanie history is obtained through Stephanie use of an interpreter.  HPI Stephanie Stephanie [name] is a 66 y.o. female with a history of congestive heart failure, CAD status post MI, HTN, DM presenting with shortness of breath, lower extremity swelling and weight gain. Stephanie Stephanie [name] reports that she was doing well after being discharged for acute congestive heart failure exacerbation 10/9 but over Stephanie past 2 days she has noticed increasing shortness of breath with cough productive of white frothy sputum, 2.5 kg weight gain, and swelling around Stephanie feet. She has not had any congestion or rhinorrhea, fever or chills. No nausea vomiting or diarrhea. No chest pain, palpitations, lightheadedness or syncope. She tried taking additional Lasix and torsemide, without improvement of her symptoms. Her daughter checked her blood pressure, which was between Stephanie 80s and 90s systolic.  Per EMS, Stephanie Stephanie [name] had normal oxygenation at home. On my examination, Stephanie Stephanie [name] has a blood pressure of 85/46   Past Medical History:  Diagnosis Date  . CHF (congestive heart failure) (HCC)   . Diabetes mellitus without complication (HCC)   . Hypertension   . MI (myocardial infarction) [facility])     Stephanie [name] Active Problem List   Diagnosis Date Noted  . Acute on chronic systolic heart failure (HCC) 12/04/2016    Past Surgical History:  Procedure Laterality Date  . CARDIAC SURGERY    . CORONARY ANGIOPLASTY WITH STENT PLACEMENT      Current Outpatient Rx  . Order #: [id number] Class: Historical Med  . Order #: [id number] Class: Historical Med  . Order #: [id number] Class: Historical Med  . Order #: [id number] Class: Historical Med  .  Order #: [id number] Class: Print  . Order #: [id number] Class: Historical Med  . Order #: [id number] Class: Print  . Order #: [id number] Class: Historical Med  . Order #: [id number] Class: Historical Med  . Order #: [id number] Class: Historical Med  . Order #: [id number] Class: Print  . Order #: [id number] Class: Historical Med  . Order #: [id number] Class: Historical Med    Allergies Stephanie [name] has no known allergies.  Family History  Problem Relation Age of Onset  . Hypertension Other     Social History Social History  Substance Use Topics  . Smoking status: Never Smoker  . Smokeless tobacco: Never Used  . Alcohol use No    Review of Systems Constitutional: No fever/chills.no lightheadedness or syncope. Positive general malaise. Eyes: No visual changes. ENT: No sore throat. No congestion or rhinorrhea.no ear pain. Cardiovascular: Denies chest pain. Denies palpitations. Respiratory: positiveshortness of breath.  Positive productivecough. Gastrointestinal: No abdominal pain.  No nausea, no vomiting.  No diarrhea.  No constipation. Genitourinary: Negative for dysuria. Musculoskeletal: Negative for back pain.positive foot swelling. Skin: Negative for rash. Neurological: Negative for headaches. No focal numbness, tingling or weakness.     ____________________________________________   PHYSICAL EXAM:  VITAL SIGNS: ED Triage Vitals  Enc Vitals Group     BP 12/14/16 1303 (!) 96/46     Pulse Rate 12/14/16 1303 60     Resp 12/14/16 1303 18     Temp 12/14/16 1303 97.9 F (36.6 C)     Temp Source 12/14/16 1303 Oral     SpO2 12/14/16 1303 98 %     Weight 12/14/16 1303 125 lb (56.7  kg)     Height 12/14/16 1303 5' (1.524 m)     Head Circumference --      Peak Flow --      Pain Score 12/14/16 1302 0     Pain Loc --      Pain Edu? --      Excl. in GC? --     Constitutional: Alert and oriented. hronically ill appearing and mildlyuncomfortable but nontoxic. Answers questions  appropriately. Eyes: Conjunctivae are normal.  EOMI. No scleral icterus.o eye discharge for Head: Atraumatic. Nose: No congestion/rhinnorhea. Mouth/Throat: Mucous membranes are dry.  Neck: No stridor.  Supple.  No JVD. No meningismus. Cardiovascular: Normal rate, regular rhythm. No murmurs, rubs or gallops. lood pressure is in Stephanie 80s over 40s and then repeat 90s over 40s on my examination. Respiratory: mild tachypnea with mild accessory muscle use but no retractions. Stephanie Stephanie [name] is able to speak in 4-5 word sentences without difficulty. She has rales in Stephanie bases bilaterally without wheezes or rhonchi. Gastrointestinal: Soft, and nondistended.  Minimal active gastric discomfort on my examination. Negative Murphy sign. No guarding or rebound.  No peritoneal signs. Musculoskeletal: Stephanie [name] has minimal swelling around Stephanie ankles and proximal foot that is nonpitting. No ttp in Stephanie calves or palpable cords.  Negative Homan's sign. Neurologic:  A&Ox3.  Speech is clear.  Face and smile are symmetric.  EOMI.  Moves all extremities well. Skin:  Skin is warm, dry and intact. No rash noted. Psychiatric: Mood and affect are normal. Speech and behavior are normal.  Normal judgement.  ____________________________________________   LABS (all labs ordered are listed, but only abnormal results are displayed)  Labs Reviewed  CBC - Abnormal; Notable for Stephanie following:       Result Value   Hemoglobin 11.1 (*)    HCT 33.3 (*)    RDW 16.7 (*)    All other components within normal limits  BASIC METABOLIC PANEL - Abnormal; Notable for Stephanie following:    Glucose, Bld 171 (*)    BUN 33 (*)    Creatinine, Ser 1.07 (*)    Calcium 8.8 (*)    GFR calc non Af Amer 54 (*)    All other components within normal limits  BRAIN NATRIURETIC PEPTIDE - Abnormal; Notable for Stephanie following:    B Natriuretic Peptide 1,769.0 (*)    All other components within normal limits  TROPONIN I - Abnormal; Notable for Stephanie following:     Troponin I 0.03 (*)    All other components within normal limits  INFLUENZA PANEL BY PCR (TYPE A & B)   ____________________________________________  EKG  ED ECG REPORT I, Rockne MenghiniNorman, Anne-Caroline, Stephanie attending physician, personally viewed and interpreted this ECG.   Date: 12/14/2016  EKG Time: 1316  Rate: 57  Rhythm: sinus bradycardia  Axis: normal  Intervals:none  ST&T Change: no STEMI.  ____________________________________________  RADIOLOGY  Dg Chest 2 View  Result Date: 12/14/2016 CLINICAL DATA:  Shortness of breath. EXAM: CHEST  2 VIEW COMPARISON:  12/04/2016 FINDINGS: Stephanie cardiac silhouette remains borderline enlarged. Peribronchial thickening and streaky perihilar and infrahilar opacities on Stephanie prior study have essentially resolved. No airspace consolidation, edema, pleural effusion, or pneumothorax is identified. No acute osseous abnormality is seen. IMPRESSION: No active cardiopulmonary disease. Electronically Signed   By: Sebastian AcheAllen  Grady M.D.   On: 12/14/2016 13:42    ____________________________________________   PROCEDURES  Procedure(s) performed: None  Procedures  Critical Care performed: Yes, see critical care note(s) ____________________________________________  INITIAL IMPRESSION / ASSESSMENT AND PLAN / ED COURSE  Pertinent labs & imaging results that were available during my care of Stephanie Stephanie [name] were reviewed by me and considered in my medical decision making (see chart for details).  66 y.o. female with a history of congestive heart failure presenting with shortness of breath, cough, and weight gain. Overall, Stephanie Stephanie [name]'s history and physical is most consistent with acute CHF exacerbation. She is hypotensive, which may be from hypovolemia of increasing her diuretic. She is also bradycardic which is likely iatrogenic to her medications. Infection may be a cause for her symptoms but his is less likely; we will do chest x-ray to look for pneumonia and an  influenza test today but again this is less likely. On room air, Stephanie Stephanie [name] is able to maintain oxygen saturations in Stephanie mid 90s and we will do an ambulatory pulse oximetry chest to see how she tolerates ambulation. Laboratory studies are pending. I will hold further diuresis until we have Stephanie results as I'm concerned about her intravascular fluid status.  ----------------------------------------- 3:10 PM on 12/14/2016 -----------------------------------------  At this time, Stephanie Stephanie [name]'s clinical course and workup in Stephanie emergency department has been reassuring. Her influenza testing is negative. Her chest x-ray does not show any pulmonary edema, nor does she have any significant peripheral edema. She is feeling better since her arrival. Her creatinine is 1.07 which is significantly improved since her admission. She does have an elevated BNP at greater than 1700, but this is not far from her baseline. Her troponin is 0.03 and without any episodes of chest pain, it is unlikely that this is due to ACS or MI; it is more likely related to strain that is baseline for her.  I spoken with Dr. Mariah Milling, who is on-call for Stephanie Stephanie [name]'s primary cardiologist, Dr. Kirke Corin. He agrees that Stephanie Stephanie [name] is safe for discharge at this time. His recommendation is to give Stephanie Stephanie [name] 40 mg of Lasix IV, and have her reevaluated tomorrow.  ____________________________________________  FINAL CLINICAL IMPRESSION(S) / ED DIAGNOSES  Final diagnoses:  Hypotension, unspecified hypotension type  Sinus bradycardia  Acute on chronic congestive heart failure, unspecified heart failure type (HCC)         NEW MEDICATIONS STARTED DURING THIS VISIT:  New Prescriptions   No medications on file      Rockne Menghini, MD 12/14/16 1511

## 2016-12-14 NOTE — Telephone Encounter (Signed)
S/w Chelsea PrimusAshley Ross, RN, at [facility] regarding sooner appt. She will notify patient of 10/19, 11:20am appt with Dr. Kirke CorinArida.

## 2016-12-14 NOTE — ED Notes (Signed)
Spoke with Jasmine DecemberSharon from [facility], Pt has sooner available appt on 10/19 at 11:20 am, this information was relayed to the patient via the interpreter services [employer]

## 2016-12-14 NOTE — Discharge Instructions (Signed)
Please continue to take all your medications as prescribed at home.  Return to the emergency department if you develop shortness of breath, chest pain, lightheadedness or fainting, fever or any other symptoms concerning to you.

## 2016-12-14 NOTE — ED Notes (Signed)
Family informed via interpreter about cardiology appointment scheduled for Friday. Also informed them the office has been trying to call them numerous times but there is no voicemail set up and no one answered.

## 2016-12-14 NOTE — ED Notes (Signed)
Pt transported to XR.  

## 2016-12-14 NOTE — ED Triage Notes (Signed)
Pt arrived via EMS from home with reports of shortness of breath, [place]. [employer] production plannerArabic translator used for triage and assessment.  Pt has upcoming cardiology appointment with Dr. Kirke CorinArida. Pt states she feels there is fluid on her lungs.  Daughter states she takes pt's BP and checks her weight.

## 2016-12-14 NOTE — ED Notes (Addendum)
EDP in room, Arabic interpreter used via [employer] interpre[employer]

## 2016-12-16 ENCOUNTER — Ambulatory Visit (INDEPENDENT_AMBULATORY_CARE_PROVIDER_SITE_OTHER): Payer: Self-pay | Admitting: Cardiovascular Disease

## 2016-12-16 ENCOUNTER — Encounter: Payer: Self-pay | Admitting: Cardiovascular Disease

## 2016-12-16 VITALS — BP 90/58 | HR 63 | Ht 64.0 in | Wt 126.0 lb

## 2016-12-16 DIAGNOSIS — I5022 Chronic systolic (congestive) heart failure: Secondary | ICD-10-CM

## 2016-12-16 DIAGNOSIS — Z7689 Persons encountering health services in other specified circumstances: Secondary | ICD-10-CM

## 2016-12-16 DIAGNOSIS — I2510 Atherosclerotic heart disease of native coronary artery without angina pectoris: Secondary | ICD-10-CM

## 2016-12-16 NOTE — Patient Instructions (Signed)
Medication Instructions: Continue same medications.   Labwork: None.   Procedures/Testing: None.   Follow-Up: Follow up [facility].   Any Additional Special Instructions Will Be Listed Below (If Applicable).     If you need a refill on your cardiac medications before your next appointment, please call your pharmacy.
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
## 2016-12-20 ENCOUNTER — Encounter: Payer: Self-pay | Admitting: Emergency Medicine

## 2016-12-20 ENCOUNTER — Emergency Department: Payer: Self-pay

## 2016-12-20 ENCOUNTER — Ambulatory Visit: Payer: Self-pay | Admitting: Nurse Practitioner

## 2016-12-20 ENCOUNTER — Inpatient Hospital Stay
Admission: EM | Admit: 2016-12-20 | Discharge: 2016-12-22 | DRG: 293 | Disposition: A | Discharge status: Home / Self Care | Payer: Self-pay | Attending: Specialist | Admitting: Specialist

## 2016-12-20 DIAGNOSIS — E079 Disorder of thyroid, unspecified: Secondary | ICD-10-CM | POA: Diagnosis present

## 2016-12-20 DIAGNOSIS — E039 Hypothyroidism, unspecified: Secondary | ICD-10-CM | POA: Diagnosis present

## 2016-12-20 DIAGNOSIS — I110 Hypertensive heart disease with heart failure: Principal | ICD-10-CM | POA: Diagnosis present

## 2016-12-20 DIAGNOSIS — Z7902 Long term (current) use of antithrombotics/antiplatelets: Secondary | ICD-10-CM

## 2016-12-20 DIAGNOSIS — Z794 Long term (current) use of insulin: Secondary | ICD-10-CM

## 2016-12-20 DIAGNOSIS — I208 Other forms of angina pectoris: Secondary | ICD-10-CM

## 2016-12-20 DIAGNOSIS — Z79899 Other long term (current) drug therapy: Secondary | ICD-10-CM

## 2016-12-20 DIAGNOSIS — Z7982 Long term (current) use of aspirin: Secondary | ICD-10-CM

## 2016-12-20 DIAGNOSIS — I2510 Atherosclerotic heart disease of native coronary artery without angina pectoris: Secondary | ICD-10-CM | POA: Diagnosis present

## 2016-12-20 DIAGNOSIS — M109 Gout, unspecified: Secondary | ICD-10-CM | POA: Diagnosis present

## 2016-12-20 DIAGNOSIS — I255 Ischemic cardiomyopathy: Secondary | ICD-10-CM | POA: Diagnosis present

## 2016-12-20 DIAGNOSIS — I252 Old myocardial infarction: Secondary | ICD-10-CM

## 2016-12-20 DIAGNOSIS — Z8249 Family history of ischemic heart disease and other diseases of the circulatory system: Secondary | ICD-10-CM

## 2016-12-20 DIAGNOSIS — I25119 Atherosclerotic heart disease of native coronary artery with unspecified angina pectoris: Secondary | ICD-10-CM | POA: Diagnosis present

## 2016-12-20 DIAGNOSIS — E119 Type 2 diabetes mellitus without complications: Secondary | ICD-10-CM

## 2016-12-20 DIAGNOSIS — I5023 Acute on chronic systolic (congestive) heart failure: Secondary | ICD-10-CM | POA: Diagnosis present

## 2016-12-20 DIAGNOSIS — E785 Hyperlipidemia, unspecified: Secondary | ICD-10-CM | POA: Diagnosis present

## 2016-12-20 DIAGNOSIS — Z955 Presence of coronary angioplasty implant and graft: Secondary | ICD-10-CM

## 2016-12-20 DIAGNOSIS — I959 Hypotension, unspecified: Secondary | ICD-10-CM | POA: Diagnosis present

## 2016-12-20 DIAGNOSIS — I509 Heart failure, unspecified: Secondary | ICD-10-CM

## 2016-12-20 LAB — CBC WITH DIFFERENTIAL/PLATELET
BASOS ABS: 0 10*3/uL (ref 0–0.1)
BASOS PCT: 0 %
EOS ABS: 0.2 10*3/uL (ref 0–0.7)
EOS PCT: 2 %
HCT: 35.7 % (ref 35.0–47.0)
Hemoglobin: 11.6 g/dL — ABNORMAL LOW (ref 12.0–16.0)
Lymphocytes Relative: 37 %
Lymphs Abs: 4.2 10*3/uL — ABNORMAL HIGH (ref 1.0–3.6)
MCH: 26.2 pg (ref 26.0–34.0)
MCHC: 32.5 g/dL (ref 32.0–36.0)
MCV: 80.7 fL (ref 80.0–100.0)
MONO ABS: 0.6 10*3/uL (ref 0.2–0.9)
Monocytes Relative: 6 %
NEUTROS ABS: 6.4 10*3/uL (ref 1.4–6.5)
Neutrophils Relative %: 55 %
PLATELETS: 312 10*3/uL (ref 150–440)
RBC: 4.42 MIL/uL (ref 3.80–5.20)
RDW: 16.9 % — AB (ref 11.5–14.5)
WBC: 11.6 10*3/uL — ABNORMAL HIGH (ref 3.6–11.0)

## 2016-12-20 LAB — COMPREHENSIVE METABOLIC PANEL
ALBUMIN: 3.6 g/dL (ref 3.5–5.0)
ALK PHOS: 79 U/L (ref 38–126)
ALT: 31 U/L (ref 14–54)
AST: 46 U/L — AB (ref 15–41)
Anion gap: 11 (ref 5–15)
BILIRUBIN TOTAL: 0.7 mg/dL (ref 0.3–1.2)
BUN: 41 mg/dL — AB (ref 6–20)
CALCIUM: 8.6 mg/dL — AB (ref 8.9–10.3)
CO2: 26 mmol/L (ref 22–32)
CREATININE: 1.21 mg/dL — AB (ref 0.44–1.00)
Chloride: 103 mmol/L (ref 101–111)
GFR calc Af Amer: 54 mL/min — ABNORMAL LOW (ref >60)
GFR calc non Af Amer: 47 mL/min — ABNORMAL LOW (ref >60)
GLUCOSE: 89 mg/dL (ref 65–99)
POTASSIUM: 3.6 mmol/L (ref 3.5–5.1)
Sodium: 140 mmol/L (ref 135–145)
TOTAL PROTEIN: 6.4 g/dL — AB (ref 6.5–8.1)

## 2016-12-20 LAB — BRAIN NATRIURETIC PEPTIDE: B Natriuretic Peptide: 1684 pg/mL — ABNORMAL HIGH (ref 0.0–100.0)

## 2016-12-20 LAB — URINALYSIS, COMPLETE (UACMP) WITH MICROSCOPIC
Bacteria, UA: NONE SEEN
Bilirubin Urine: NEGATIVE
Glucose, UA: NEGATIVE mg/dL
Hgb urine dipstick: NEGATIVE
Ketones, ur: NEGATIVE mg/dL
NITRITE: NEGATIVE
PH: 6 (ref 5.0–8.0)
Protein, ur: NEGATIVE mg/dL
RBC / HPF: NONE SEEN RBC/hpf (ref 0–5)
SPECIFIC GRAVITY, URINE: 1.005 (ref 1.005–1.030)
Squamous Epithelial / LPF: NONE SEEN

## 2016-12-20 MED ORDER — ASPIRIN 81 MG PO CHEW
324.0000 mg | CHEWABLE_TABLET | Freq: Once | ORAL | Status: AC
  Administered 2016-12-20: 324 mg via ORAL
  Filled 2016-12-20: qty 4

## 2016-12-20 MED ORDER — FUROSEMIDE 10 MG/ML IJ SOLN
40.0000 mg | Freq: Once | INTRAMUSCULAR | Status: AC
  Administered 2016-12-20: 40 mg via INTRAVENOUS
  Filled 2016-12-20: qty 4

## 2016-12-20 MED ORDER — ONDANSETRON HCL 4 MG/2ML IJ SOLN
INTRAMUSCULAR | Status: AC
  Administered 2016-12-20: 4 mg via INTRAVENOUS
  Filled 2016-12-20: qty 2

## 2016-12-20 MED ORDER — ONDANSETRON HCL 4 MG/2ML IJ SOLN
4.0000 mg | Freq: Once | INTRAMUSCULAR | Status: AC
  Administered 2016-12-20: 4 mg via INTRAVENOUS

## 2016-12-20 NOTE — ED Notes (Signed)
Received report from Steven RN.

## 2016-12-20 NOTE — ED Provider Notes (Signed)
[facility] Emergency Department Provider Note  ____________________________________________   First MD Initiated Contact with Patient 12/20/16 2210     (approximate)  I have reviewed the triage vital signs and the nursing notes.   HISTORY  Chief Complaint Cough and Shortness of Breath  History obtained via telephone interpreter  HPI Stephanie [name] is a 66 y.o. female who comes to the emergency departmentvia EMS with shortness of breath and productive cough for the past day. The patient has a complex past medical history including severe congestive heart failure. She was recently admitted to "get fluid off of my lungs". After that admission she felt well until his past day. Her symptoms are worse with exertion. They're moderate to severe. There are improved with rest.   12/04/16 Echo: Study Conclusions  - Left ventricle: Systolic function was severely reduced. The   estimated ejection fraction was in the range of 20% to 25%. - Regional wall motion abnormality: Akinesis of the mid   anteroseptal myocardium; moderate hypokinesis of the mid anterior   and mid anterolateral myocardium.   Past Medical History:  Diagnosis Date  . CHF (congestive heart failure) (HCC)   . Diabetes mellitus without complication (HCC)   . Hyperlipidemia   . Hypertension   . MI (myocardial infarction) (HCC)   . Thyroid disease     Patient Active Problem List   Diagnosis Date Noted  . Acute on chronic systolic heart failure (HCC) 12/04/2016    Past Surgical History:  Procedure Laterality Date  . CARDIAC SURGERY    . CORONARY ANGIOPLASTY WITH STENT PLACEMENT      Prior to Admission medications   Medication Sig Start Date End Date Taking? Authorizing Provider  allopurinol (ZYLOPRIM) 100 MG tablet Take 100 mg by mouth daily.    [provider]  aspirin 81 MG chewable tablet Chew by mouth daily.    [provider]  carvedilol (COREG) 6.25 MG tablet  Take 6.25 mg by mouth 2 (two) times daily with a meal.    [provider]  clopidogrel (PLAVIX) 75 MG tablet Take 75 mg by mouth daily.    [provider]  fluorometholone (FML) 0.1 % ophthalmic suspension Place 1 drop into both eyes 3 (three) times daily.    [provider]  furosemide (LASIX) 20 MG tablet Take 1 tablet (20 mg total) by mouth 2 (two) times daily. 12/06/16   Milagros LollSudini, Srikar, MD  Insulin Glargine (LANTUS SOLOSTAR) 100 UNIT/ML Solostar Pen Inject 28 Units into the skin at bedtime.    [provider]  Insulin Glulisine (APIDRA SOLOSTAR) 100 UNIT/ML Solostar Pen Inject 12-14 Units into the skin See admin instructions. 14 units in the morning before breakfast then 14 units before lunch then 12 units before evening meal    [provider]  ivabradine (CORLANOR) 7.5 MG TABS tablet Take 7.5 mg by mouth 2 (two) times daily with a meal.    [provider]  levothyroxine (SYNTHROID) 25 MCG tablet Take 25 mcg by mouth daily before breakfast.    [provider]  NON FORMULARY ProCoralan 7.5 mg tablet twice daily.    [provider]  potassium chloride SA (K-DUR,KLOR-CON) 20 MEQ tablet Take 1 tablet (20 mEq total) by mouth daily. 11/28/16   Mancel BaleWentz, Elliott, MD  rosuvastatin (CRESTOR) 20 MG tablet Take 20 mg by mouth at bedtime.    [provider]  sacubitril-valsartan (ENTRESTO) 24-26 MG Take 1 tablet by mouth 2 (two) times daily.  [provider]  UNABLE TO FIND Vismed (0.18% Sodium Hyaluronate): Instill 1 drop into both eyes three times a day    [provider]    Allergies Patient has no known allergies.  Family History  Problem Relation Age of Onset  . Hypertension Other     Social History Social History  Substance Use Topics  . Smoking status: Never Smoker  . Smokeless tobacco: Never Used  . Alcohol use No    Review of Systems Constitutional: No fever/chills Eyes: No visual  changes. ENT: No sore throat. Cardiovascular: Positive for chest pain. Respiratory: Positive for shortness of breath. Gastrointestinal: No abdominal pain.  No nausea, no vomiting.  No diarrhea.  No constipation. Genitourinary: Negative for dysuria. Musculoskeletal: Negative for back pain. Skin: Negative for rash. Neurological: Negative for headaches, focal weakness or numbness.   ____________________________________________   PHYSICAL EXAM:  VITAL SIGNS: ED Triage Vitals  Enc Vitals Group     BP      Pulse      Resp      Temp      Temp src      SpO2      Weight      Height      Head Circumference      Peak Flow      Pain Score      Pain Loc      Pain Edu?      Excl. in GC?     Constitutional: Appears uncomfortable tearful coughing Eyes: PERRL EOMI. Head: Atraumatic. Nose: No congestion/rhinnorhea. Mouth/Throat: No trismus Neck: No stridor.   Cardiovascular: Normal rate, regular rhythm. Grossly normal heart sounds.  Good peripheral circulation. Able to lie completely flat but with mild jugular venous distention Respiratory: Increased respiratory effort with rales at bilateral bases Gastrointestinal: Soft nontender Musculoskeletal: Trace pitting edema bilateral lower extremities legs are equal in size  Neurologic:  Normal speech and language. No gross focal neurologic deficits are appreciated. Skin:  Skin is warm, dry and intact. No rash noted. Psychiatric: Mood and affect are normal. Speech and behavior are normal.    ____________________________________________   DIFFERENTIAL includes but not limited to  Acute coronary syndrome, congestive heart failure, fluid overload, metabolic derangement ____________________________________________   LABS (all labs ordered are listed, but only abnormal results are displayed)  Labs Reviewed  CBC WITH DIFFERENTIAL/PLATELET - Abnormal; Notable for the following:       Result Value   WBC 11.6 (*)    Hemoglobin 11.6 (*)     RDW 16.9 (*)    Lymphs Abs 4.2 (*)    All other components within normal limits  URINALYSIS, COMPLETE (UACMP) WITH MICROSCOPIC  BRAIN NATRIURETIC PEPTIDE  COMPREHENSIVE METABOLIC PANEL  TROPONIN I    Bloodwork reviewed and interpreted by me shows possible early ischemia and some evidence of fluid overload __________________________________________  EKG  ED ECG REPORT I, Merrily Brittle, the attending physician, personally viewed and interpreted this ECG.  Date: 12/20/2016 EKG Time:  Rate: 74 Rhythm: normal sinus rhythm QRS Axis: normal Intervals: normal ST/T Wave abnormalities: ST depression 23 aVF Narrative Interpretation: Normal sinus rhythm at 74 low voltage and poor R-wave progression borderline ST depression 23 and aVF with no reciprocal ST elevation abnormal EKG  ____________________________________________  RADIOLOGY  Chest x-ray reviewed by me concerning for mild edema ____________________________________________   PROCEDURES  Procedure(s) performed: no  Procedures  Critical Care performed: no  Observation: no ____________________________________________   INITIAL IMPRESSION / ASSESSMENT AND PLAN /  ED COURSE  Pertinent labs & imaging results that were available during my care of the patient were reviewed by me and considered in my medical decision making (see chart for details).  The patient arrives uncomfortable appearing nauseated with productive cough. She does appear mildly fluid overloaded. Her first EKG shows ST depression inferiorly concerning for subendocardial ischemia. She does have a significant cardiac history. 324 mg aspirin now and reevaluation pending.     The patient's first troponin and beta natruretic peptide are mildly positive. More concerning is her EKG shows new ST depressions inferiorly. She is difficult to control secondary to chronic hypotension, however at this point given acute ST changes she will require inpatient admission  for full rule out and cardiology consultation. The patient and her family verbalized understanding and agreement with the plan. I discussed the case with the hospitalist Dr. Anne Hahn who is graciously agreed to admit the patient to his service. ____________________________________________   FINAL CLINICAL IMPRESSION(S) / ED DIAGNOSES  Final diagnoses:  Acute on chronic congestive heart failure, unspecified heart failure type (HCC)  Angina at rest [facility])      NEW MEDICATIONS STARTED DURING THIS VISIT:  New Prescriptions   No medications on file     Note:  This document was prepared using Dragon voice recognition software and may include unintentional dictation errors.     Merrily Brittle, MD 12/21/16 [phone]

## 2016-12-20 NOTE — ED Notes (Signed)
Lavender and light green tubes for blood redrawn and sent to lab.

## 2016-12-20 NOTE — ED Notes (Signed)
Video interpreter used for triage

## 2016-12-20 NOTE — ED Notes (Signed)
Pt denies pain via interpreter.

## 2016-12-20 NOTE — ED Triage Notes (Signed)
Pt to ED via EMS from place of employment c/o SOB and productive cough.  Per EMS patient with yellow productive cough and labored breathing, states patient recently dx with CHF and had fluids drawn off 1 week ago.  Pt speaks arabic.

## 2016-12-21 DIAGNOSIS — E119 Type 2 diabetes mellitus without complications: Secondary | ICD-10-CM

## 2016-12-21 DIAGNOSIS — I2510 Atherosclerotic heart disease of native coronary artery without angina pectoris: Secondary | ICD-10-CM | POA: Diagnosis present

## 2016-12-21 DIAGNOSIS — E1159 Type 2 diabetes mellitus with other circulatory complications: Secondary | ICD-10-CM

## 2016-12-21 DIAGNOSIS — I5023 Acute on chronic systolic (congestive) heart failure: Secondary | ICD-10-CM

## 2016-12-21 DIAGNOSIS — I25118 Atherosclerotic heart disease of native coronary artery with other forms of angina pectoris: Secondary | ICD-10-CM

## 2016-12-21 DIAGNOSIS — E039 Hypothyroidism, unspecified: Secondary | ICD-10-CM | POA: Diagnosis present

## 2016-12-21 DIAGNOSIS — I255 Ischemic cardiomyopathy: Secondary | ICD-10-CM

## 2016-12-21 LAB — GLUCOSE, CAPILLARY
GLUCOSE-CAPILLARY: 131 mg/dL — AB (ref 65–99)
GLUCOSE-CAPILLARY: 196 mg/dL — AB (ref 65–99)
GLUCOSE-CAPILLARY: 145 mg/dL — AB (ref 65–99)
Glucose-Capillary: 177 mg/dL — ABNORMAL HIGH (ref 65–99)

## 2016-12-21 LAB — BASIC METABOLIC PANEL
Anion gap: 10 (ref 5–15)
BUN: 38 mg/dL — ABNORMAL HIGH (ref 6–20)
CHLORIDE: 106 mmol/L (ref 101–111)
CO2: 27 mmol/L (ref 22–32)
CREATININE: 1.19 mg/dL — AB (ref 0.44–1.00)
Calcium: 8.4 mg/dL — ABNORMAL LOW (ref 8.9–10.3)
GFR, EST AFRICAN AMERICAN: 55 mL/min — AB (ref >60)
GFR, EST NON AFRICAN AMERICAN: 48 mL/min — AB (ref >60)
Glucose, Bld: 126 mg/dL — ABNORMAL HIGH (ref 65–99)
POTASSIUM: 3.4 mmol/L — AB (ref 3.5–5.1)
SODIUM: 143 mmol/L (ref 135–145)

## 2016-12-21 LAB — CBC
HCT: 30.6 % — ABNORMAL LOW (ref 35.0–47.0)
Hemoglobin: 9.7 g/dL — ABNORMAL LOW (ref 12.0–16.0)
MCH: 25.7 pg — ABNORMAL LOW (ref 26.0–34.0)
MCHC: 31.8 g/dL — ABNORMAL LOW (ref 32.0–36.0)
MCV: 80.7 fL (ref 80.0–100.0)
PLATELETS: 250 10*3/uL (ref 150–440)
RBC: 3.79 MIL/uL — AB (ref 3.80–5.20)
RDW: 16.4 % — ABNORMAL HIGH (ref 11.5–14.5)
WBC: 9.6 10*3/uL (ref 3.6–11.0)

## 2016-12-21 LAB — TROPONIN I
TROPONIN I: 0.03 ng/mL — AB (ref <0.03)
Troponin I: 0.03 ng/mL (ref <0.03)
Troponin I: 0.04 ng/mL (ref <0.03)
Troponin I: 0.03 ng/mL (ref <0.03)

## 2016-12-21 MED ORDER — CLOPIDOGREL BISULFATE 75 MG PO TABS
75.0000 mg | ORAL_TABLET | Freq: Every day | ORAL | Status: DC
  Administered 2016-12-21 – 2016-12-22 (×2): 75 mg via ORAL
  Filled 2016-12-21 (×2): qty 1

## 2016-12-21 MED ORDER — ONDANSETRON HCL 4 MG/2ML IJ SOLN
4.0000 mg | Freq: Four times a day (QID) | INTRAMUSCULAR | Status: DC | PRN
  Administered 2016-12-21: 4 mg via INTRAVENOUS
  Filled 2016-12-21: qty 2

## 2016-12-21 MED ORDER — LEVOTHYROXINE SODIUM 25 MCG PO TABS
12.5000 ug | ORAL_TABLET | Freq: Every day | ORAL | Status: DC
  Administered 2016-12-21 – 2016-12-22 (×2): 12.5 ug via ORAL
  Filled 2016-12-21 (×2): qty 1

## 2016-12-21 MED ORDER — INSULIN ASPART 100 UNIT/ML ~~LOC~~ SOLN
0.0000 [IU] | Freq: Three times a day (TID) | SUBCUTANEOUS | Status: DC
  Administered 2016-12-21: 1 [IU] via SUBCUTANEOUS
  Administered 2016-12-21: 2 [IU] via SUBCUTANEOUS
  Administered 2016-12-21: 1 [IU] via SUBCUTANEOUS
  Administered 2016-12-22: 2 [IU] via SUBCUTANEOUS
  Administered 2016-12-22 (×2): 7 [IU] via SUBCUTANEOUS
  Filled 2016-12-21 (×6): qty 1

## 2016-12-21 MED ORDER — ACETAMINOPHEN 650 MG RE SUPP: 650.0000 mg | Freq: Four times a day (QID) | RECTAL | Status: DC | PRN

## 2016-12-21 MED ORDER — SPIRONOLACTONE 25 MG PO TABS
12.5000 mg | ORAL_TABLET | Freq: Every day | ORAL | Status: DC
  Filled 2016-12-21: qty 1

## 2016-12-21 MED ORDER — POTASSIUM CHLORIDE CRYS ER 20 MEQ PO TBCR
20.0000 meq | EXTENDED_RELEASE_TABLET | Freq: Once | ORAL | Status: AC
  Administered 2016-12-22: 20 meq via ORAL
  Filled 2016-12-21: qty 1

## 2016-12-21 MED ORDER — ROSUVASTATIN CALCIUM 10 MG PO TABS
20.0000 mg | ORAL_TABLET | Freq: Every day | ORAL | Status: DC
  Administered 2016-12-21: 20 mg via ORAL
  Filled 2016-12-21: qty 2

## 2016-12-21 MED ORDER — ENOXAPARIN SODIUM 40 MG/0.4ML ~~LOC~~ SOLN
40.0000 mg | SUBCUTANEOUS | Status: DC
  Administered 2016-12-21 – 2016-12-22 (×2): 40 mg via SUBCUTANEOUS
  Filled 2016-12-21: qty 0.4

## 2016-12-21 MED ORDER — ACETAMINOPHEN 325 MG PO TABS: 650.0000 mg | ORAL_TABLET | Freq: Four times a day (QID) | ORAL | Status: DC | PRN

## 2016-12-21 MED ORDER — SPIRONOLACTONE 25 MG PO TABS
25.0000 mg | ORAL_TABLET | Freq: Every day | ORAL | Status: DC
  Administered 2016-12-21: 25 mg via ORAL
  Filled 2016-12-21: qty 1

## 2016-12-21 MED ORDER — INSULIN ASPART 100 UNIT/ML ~~LOC~~ SOLN: 0.0000 [IU] | Freq: Every day | SUBCUTANEOUS | Status: DC

## 2016-12-21 MED ORDER — IVABRADINE HCL 7.5 MG PO TABS
7.5000 mg | ORAL_TABLET | Freq: Two times a day (BID) | ORAL | Status: DC
  Administered 2016-12-21 (×2): 7.5 mg via ORAL
  Filled 2016-12-21 (×3): qty 1

## 2016-12-21 MED ORDER — CARVEDILOL 3.125 MG PO TABS
3.1250 mg | ORAL_TABLET | Freq: Two times a day (BID) | ORAL | Status: DC
  Administered 2016-12-21: 3.125 mg via ORAL
  Filled 2016-12-21 (×2): qty 1

## 2016-12-21 MED ORDER — FLUOROMETHOLONE 0.1 % OP SUSP
1.0000 [drp] | Freq: Three times a day (TID) | OPHTHALMIC | Status: DC
  Administered 2016-12-21 – 2016-12-22 (×4): 1 [drp] via OPHTHALMIC
  Filled 2016-12-21: qty 5

## 2016-12-21 MED ORDER — FUROSEMIDE 10 MG/ML IJ SOLN
40.0000 mg | Freq: Three times a day (TID) | INTRAMUSCULAR | Status: DC
  Administered 2016-12-21 – 2016-12-22 (×3): 40 mg via INTRAVENOUS
  Filled 2016-12-21 (×3): qty 4

## 2016-12-21 MED ORDER — SACUBITRIL-VALSARTAN 24-26 MG PO TABS
1.0000 | ORAL_TABLET | Freq: Two times a day (BID) | ORAL | Status: DC
  Administered 2016-12-21: 1 via ORAL
  Filled 2016-12-21 (×2): qty 1

## 2016-12-21 MED ORDER — FUROSEMIDE 20 MG PO TABS
20.0000 mg | ORAL_TABLET | Freq: Two times a day (BID) | ORAL | Status: DC
  Administered 2016-12-21: 20 mg via ORAL
  Filled 2016-12-21: qty 1

## 2016-12-21 MED ORDER — ONDANSETRON HCL 4 MG PO TABS: 4.0000 mg | ORAL_TABLET | Freq: Four times a day (QID) | ORAL | Status: DC | PRN

## 2016-12-21 MED ORDER — ASPIRIN 81 MG PO CHEW
81.0000 mg | CHEWABLE_TABLET | Freq: Every day | ORAL | Status: DC
  Administered 2016-12-21 – 2016-12-22 (×2): 81 mg via ORAL
  Filled 2016-12-21 (×2): qty 1

## 2016-12-21 MED ORDER — SODIUM CHLORIDE 0.9 % IV BOLUS (SEPSIS)
500.0000 mL | INTRAVENOUS | Status: AC
  Administered 2016-12-21: 500 mL via INTRAVENOUS

## 2016-12-21 NOTE — ED Notes (Signed)
Accidentally verified pt's 2:00am vital signs. These were NOT accurate!!!! BP cuff was not in position.

## 2016-12-21 NOTE — ED Notes (Signed)
Per Dr. Elias Elseiamond's orders this RN placed pt on 500ml NaCl and sent to the floor.

## 2016-12-21 NOTE — ED Notes (Signed)
Date and time results received: 12/21/16 0003 (use smartphrase ".now" to insert current time)  Test: Troponin Critical Value: 0.03  Name of Provider Notified: Dr. Lamont Snowballifenbark  Orders Received? Or Actions Taken?:

## 2016-12-21 NOTE — ED Notes (Signed)
Pt speaking with translator: Joyce GrossKay [phone] and Dr. Lamont Snowballifenbark is at the bedside.

## 2016-12-21 NOTE — Progress Notes (Signed)
Clinical research associateArabic Interpreter utilized for Building surveyorcommunication [place] # [phone]

## 2016-12-21 NOTE — Progress Notes (Signed)
Dr Sherryll BurgerShah was informed of Patient trop. Level 0.03, no new order, at this time , continue to monitor.

## 2016-12-21 NOTE — Consult Note (Signed)
Cardiology Consultation:   Patient ID: Stephanie [name]; [id number]; 09-May-1950   Admit date: 12/20/2016 Date of Consult: 12/21/2016  Primary Care Provider: Patient, No Pcp Per Primary Cardiologist: Stephanie [name]   Patient Profile:   Stephanie [name] is a 66 y.o. female with a hx of CAD s/p PCI fo the LAD and RCA in the setting of a late presenting MI in 05/2016, ICM/chronic systolic CHF with EF of 20-25% on optimal medical therapy as BP allows though unable to titrate due to BP, DM2, HTN, HLD, and hypothyroidism who is being seen today for the evaluation of recurrent acute on chronic systolic CHF/ICM at the request of Dr. Anne Hahn, MD.  History of Present Illness:   Stephanie [name] is visiting from [place] and speaks Arabic. She presented to the hospital in 05/2016 as a late presenting anterior STEMI, undergoing PCI of the LAD and RCA. Despite this, her EF continued to be low at 20-25%. She had a repeat LHC in [place] in 10/2016 that indicated patent stents in the LAD and RCA with an EF of 25%. She has had recurrent HF exacerbations with volume overload with associated SOB, abdominal fullness, early satiety, and orthopnea. She has had difficulty in up titration of her heart failure medications due to hypotension. She has been managed on Coreg, Entresto, ivabradine, Lasix, and digoxin. Spironolactone has been discontinued due to hypotension previously. She was seen in the ED on 11/28/16 with increased SOB. Labs showed a BNP of 1147, troponin negative x 2. Weight 125 pounds. CXR showed borderline CM. She was changed from torsemide to Lasix in the ED with outpatient follow up. She was admitted to [facility] 12/04/16 with acute on chronic systolic CHF. She was consulted on by [facility]. BNP was noted to be 2062, troponin mildly elevated with a peak of 0.04. SCr 1.47. Echo on 12/05/16 showed EF 20-25% with akniesis of the mid anteroseptal myocardium and moderate hypokinesis of the mid anterior and mid anteroseptal myocardium.  There has apparently been some consideration of LVAD in the past vs ICD for primary prevention. CRT has not been an option due to QRS duration long being long enough. She was diuresed successfully. She was seen in the ED on 12/14/16 for increased SOB. Documented weight stable at 125 pounds. BNP was down trending from her recent admission as above at 1769, troponi 0.03. BP was noted to be soft in the 80s systolic. CXR was not acute. Outpatient follow up was advised. She followed up with Dr. Kirke [name] on 12/16/16 and was doing well. Weight was stable at 126 pounds. She was continued on Lasix 20 mg bid with an extra dose for SOB/fluid retention. BP precluded addition of spironolactone. She was admitted on 10/23 with a two day history of increased SOB and abdominal swelling. No chest pain. Weight recorded at 132 pounds in the ED on 10/23, now down to 126 pounds this morning. BNP continued to down trend at 1684. Troponin peaked at 0.04, flat trending. HGB 9.7 with a baseline of 10-11. SCr 1.19, K+ 3.4. She was given IV Lasix 40 mg with good reported UOP, though no documented UOP. She does feel like IV Lasix has helped. BP has been on the soft side leading to holding of Coreg and Entresto at time of admission. CXR showed mild CM with slight congestion with minimal edema.   Video medical interpreter used for the encounter.   Past Medical History:  Diagnosis Date  . CHF (congestive heart failure) (HCC)   . Diabetes  mellitus without complication (HCC)   . Hyperlipidemia   . Hypertension   . MI (myocardial infarction) (HCC)   . Thyroid disease     Past Surgical History:  Procedure Laterality Date  . CARDIAC SURGERY    . CORONARY ANGIOPLASTY WITH STENT PLACEMENT       Home Meds: Prior to Admission medications   Medication Sig Start Date End Date Taking? Authorizing Provider  allopurinol (ZYLOPRIM) 100 MG tablet Take 100 mg by mouth daily.   Yes [provider]  aspirin 81 MG chewable tablet Chew 81  mg by mouth daily.    Yes [provider]  carvedilol (COREG) 6.25 MG tablet Take 6.25 mg by mouth 2 (two) times daily with a meal.   Yes [provider]  clopidogrel (PLAVIX) 75 MG tablet Take 75 mg by mouth daily.   Yes [provider]  fluorometholone (FML) 0.1 % ophthalmic suspension Place 1 drop into both eyes 3 (three) times daily.   Yes [provider]  furosemide (LASIX) 20 MG tablet Take 1 tablet (20 mg total) by mouth 2 (two) times daily. 12/06/16  Yes Sudini, Wardell Heath, MD  Insulin Glargine (LANTUS SOLOSTAR) 100 UNIT/ML Solostar Pen Inject 28 Units into the skin at bedtime.   Yes [provider]  Insulin Glulisine (APIDRA SOLOSTAR) 100 UNIT/ML Solostar Pen Inject 12-14 Units into the skin See admin instructions. 14 units in the morning before breakfast then 14 units before lunch then 12 units before evening meal   Yes [provider]  ivabradine (CORLANOR) 7.5 MG TABS tablet Take 7.5 mg by mouth 2 (two) times daily with a meal.   Yes [provider]  levothyroxine (SYNTHROID) 25 MCG tablet Take 12.5 mcg by mouth daily before breakfast.    Yes [provider]  potassium chloride SA (K-DUR,KLOR-CON) 20 MEQ tablet Take 1 tablet (20 mEq total) by mouth daily. 11/28/16  Yes Mancel Bale, MD  rosuvastatin (CRESTOR) 20 MG tablet Take 20 mg by mouth at bedtime.   Yes [provider]  UNABLE TO FIND Vismed (0.18% Sodium Hyaluronate): Instill 1 drop into both eyes three times a day   Yes [provider]  NON FORMULARY ProCoralan 7.5 mg tablet twice daily.    [provider]  sacubitril-valsartan (ENTRESTO) 24-26 MG Take 1 tablet by mouth 2 (two) times daily.    [provider]    Inpatient Medications: Scheduled Meds: . aspirin  81 mg Oral Daily  . clopidogrel  75 mg Oral Daily  . enoxaparin (LOVENOX) injection  40 mg Subcutaneous Q24H  . fluorometholone  1 drop Both Eyes TID  . furosemide   20 mg Oral BID  . insulin aspart  0-5 Units Subcutaneous QHS  . insulin aspart  0-9 Units Subcutaneous TID WC  . ivabradine  7.5 mg Oral BID WC  . levothyroxine  12.5 mcg Oral QAC breakfast  . rosuvastatin  20 mg Oral QHS   Continuous Infusions:  PRN Meds: acetaminophen **OR** acetaminophen, ondansetron **OR** ondansetron (ZOFRAN) IV  Allergies:  No Known Allergies  Social History:   Social History   Social History  . Marital status: Married    Spouse name: N/A  . Number of children: N/A  . Years of education: N/A   Occupational History  . Not on file.   Social History Main Topics  . Smoking status: Never Smoker  . Smokeless tobacco: Never Used  . Alcohol use No  . Drug use: No  . Sexual  activity: Not on file   Other Topics Concern  . Not on file   Social History Narrative  . No narrative on file     Family History:   Family History  Problem Relation Age of Onset  . Hypertension Other     ROS:  Review of Systems  Constitutional: Positive for malaise/fatigue. Negative for chills, diaphoresis, fever and weight loss.  HENT: Negative for congestion.   Eyes: Negative for discharge and redness.  Respiratory: Positive for shortness of breath. Negative for cough, hemoptysis, sputum production and wheezing.   Cardiovascular: Positive for orthopnea, claudication and leg swelling. Negative for chest pain, palpitations and PND.  Gastrointestinal: Negative for abdominal pain, blood in stool, heartburn, melena, nausea and vomiting.  Genitourinary: Negative for hematuria.  Musculoskeletal: Negative for falls and myalgias.  Skin: Negative for rash.  Neurological: Positive for weakness. Negative for dizziness, tingling, tremors, sensory change, speech change, focal weakness and loss of consciousness.  Endo/Heme/Allergies: Does not bruise/bleed easily.  Psychiatric/Behavioral: Negative for substance abuse. The patient is not nervous/anxious.   All other systems reviewed and  are negative.     Physical Exam/Data:   Vitals:   12/21/16 0200 12/21/16 0230 12/21/16 0309 12/21/16 0850  BP:  94/67 (!) 109/50 (!) 91/45  Pulse: 62 (!) 59 69 68  Resp:   17 18  Temp:   97.9 F (36.6 C) 98.1 F (36.7 C)  TempSrc:   Oral Oral  SpO2: 98% 100% 96% 96%  Weight:   126 lb 11.2 oz (57.5 kg)   Height:   4\' 11"  (1.499 m)     Intake/Output Summary (Last 24 hours) at 12/21/16 1054 Last data filed at 12/21/16 0900  Gross per 24 hour  Intake              500 ml  Output              200 ml  Net              300 ml   Filed Weights   12/20/16 2214 12/21/16 0309  Weight: 132 lb 8 oz (60.1 kg) 126 lb 11.2 oz (57.5 kg)   Body mass index is 25.59 kg/m.   Physical Exam: General: Well developed, well nourished, in no acute distress. Head: Normocephalic, atraumatic, sclera non-icteric, no xanthomas, nares without discharge.  Neck: Negative for carotid bruits. JVD not elevated. Lungs: Diminished breath sounds bilaterally with faint bibasilar crackles. Breathing is unlabored. Heart: RRR with S1 S2. No murmurs, rubs, or gallops appreciated. Abdomen: Soft, non-tender, mildly distended with normoactive bowel sounds. No hepatomegaly. No rebound/guarding. No obvious abdominal masses. Msk:  Strength and tone appear normal for age. Extremities: No clubbing or cyanosis. No edema. Distal pedal pulses are 2+ and equal bilaterally. Neuro: Alert and oriented X 3. No facial asymmetry. No focal deficit. Moves all extremities spontaneously. Psych:  Responds to questions appropriately with a normal affect.   EKG:  The EKG was personally reviewed and demonstrates: NSR, 74 bpm, LAFB, prior anteroseptal MI Telemetry:  Telemetry was personally reviewed and demonstrates: NSR, occasional PVCs  Weights: [facility] Weights   12/20/16 2214 12/21/16 0309  Weight: 132 lb 8 oz (60.1 kg) 126 lb 11.2 oz (57.5 kg)    Relevant CV Studies: TTE 12/04/2016: Study Conclusions  - Left ventricle: Systolic  function was severely reduced. The   estimated ejection fraction was in the range of 20% to 25%. - Regional wall motion abnormality: Akinesis of the mid   anteroseptal  myocardium; moderate hypokinesis of the mid anterior   and mid anterolateral myocardium.   LHC per HPI.   Laboratory Data:  Chemistry Recent Labs Lab 12/14/16 1317 12/20/16 2320 12/21/16 0517  NA 139 140 143  K 4.3 3.6 3.4*  CL 102 103 106  CO2 26 26 27   GLUCOSE 171* 89 126*  BUN 33* 41* 38*  CREATININE 1.07* 1.21* 1.19*  CALCIUM 8.8* 8.6* 8.4*  GFRNONAA 54* 47* 48*  GFRAA >60 54* 55*  ANIONGAP 11 11 10      Recent Labs Lab 12/20/16 2320  PROT 6.4*  ALBUMIN 3.6  AST 46*  ALT 31  ALKPHOS 79  BILITOT 0.7   Hematology Recent Labs Lab 12/14/16 1317 12/20/16 2208 12/21/16 0517  WBC 10.8 11.6* 9.6  RBC 4.16 4.42 3.79*  HGB 11.1* 11.6* 9.7*  HCT 33.3* 35.7 30.6*  MCV 80.1 80.7 80.7  MCH 26.6 26.2 25.7*  MCHC 33.2 32.5 31.8*  RDW 16.7* 16.9* 16.4*  PLT 255 312 250   Cardiac Enzymes Recent Labs Lab 12/14/16 1317 12/20/16 2320 12/21/16 0517  TROPONINI 0.03* 0.03* 0.04*   No results for input(s): TROPIPOC in the last 168 hours.  BNP Recent Labs Lab 12/14/16 1317 12/20/16 2320  BNP 1,769.0* 1,684.0*    DDimer No results for input(s): DDIMER in the last 168 hours.  Radiology/Studies:  Dg Chest Portable 1 View  Result Date: 12/20/2016 IMPRESSION: Mild cardiomegaly with slight central congestion. Mild diffuse interstitial opacity could reflect minimal edema. Streaky atelectasis at the right base. Small right pleural effusion. Electronically Signed   By: Jasmine PangKim  Fujinaga M.D.   On: 12/20/2016 22:37    Assessment and Plan:   1. Acute on chronic systolic CHF/ICM: -She continues to appear volume up -Increase IV Lasix to 40 mg q 8 hours with hold parameters -Restart Entresto with hold parameters -Decrease Coreg to allow for BP room for the above medications -Add spironolactone  -At  discharge, she would benefit from torsemide again as it appears she has significant bowel wall edema -Would ideally benefit from an LVAD though this is difficult for her given her social situation -Consider referral to [place]. [place] -Daily weights with strict Is and Os  2. Elevated troponinCAD: -Troponin minimally elevated with a peak of 0.04 -Likely supply demand ischemia in the setting of underlying CAD with volume overload and hypotension  -No plans for inpatient ischemic evaluation at this time -Continue DAPT with ASA and Plavix   3. Hypotension: -Has historically precluded addition or titration of HF medications -Attempt to restart medications per MD -Hold parameters added  4. HLD: -Crestor   For questions or updates, please contact [facility] Please consult [email or web address] for contact info under Cardiology/STEMI.   Signed, Eula Listenyan Aurorah Schlachter, PA-C [facility] Pager: [phone] 12/21/2016, 10:54 AM

## 2016-12-21 NOTE — H&P (Signed)
[facility] [place] at [facility]   PATIENT NAME: Stephanie [name]    MR#:  [id number]  DATE OF BIRTH:  12-Nov-1950  DATE OF ADMISSION:  12/20/2016  PRIMARY CARE PHYSICIAN: Patient, No Pcp Per   REQUESTING/REFERRING PHYSICIAN: Rifenbark, MD  CHIEF COMPLAINT:   Chief Complaint  Patient presents with  . Cough  . Shortness of Breath    HISTORY OF PRESENT ILLNESS:  Stephanie [name]  is a 66 y.o. female who presents with shortness of breath, leg swelling, fatigue, dyspnea on exertion, orthopnea. This patient speaks Arabic only, and history was obtained through an interpreter. Patient has a history of heart failure status post MI some months ago. Last EF was 20-25%. 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 She has a hard time taking her heart failure medications as her blood pressure tends to run on the low side. Today her BNP was significantly elevated at 1684, and she has significant pulmonary edema on her chest x-ray. Hospitalists were called for admission  PAST MEDICAL HISTORY:   Past Medical History:  Diagnosis Date  . CHF (congestive heart failure) (HCC)   . Diabetes mellitus without complication (HCC)   . Hyperlipidemia   . Hypertension   . MI (myocardial infarction) (HCC)   . Thyroid disease     PAST SURGICAL HISTORY:   Past Surgical History:  Procedure Laterality Date  . CARDIAC SURGERY    . CORONARY ANGIOPLASTY WITH STENT PLACEMENT      SOCIAL HISTORY:   Social History  Substance Use Topics  . Smoking status: Never Smoker  . Smokeless tobacco: Never Used  . Alcohol use No    FAMILY HISTORY:   Family History  Problem Relation Age of Onset  . Hypertension Other     DRUG ALLERGIES:  No Known Allergies  MEDICATIONS AT HOME:   Prior to Admission medications   Medication Sig Start Date End Date Taking? Authorizing Provider   allopurinol (ZYLOPRIM) 100 MG tablet Take 100 mg by mouth daily.   Yes [provider]  aspirin 81 MG chewable tablet Chew 81 mg by mouth daily.    Yes [provider]  carvedilol (COREG) 6.25 MG tablet Take 6.25 mg by mouth 2 (two) times daily with a meal.   Yes [provider]  clopidogrel (PLAVIX) 75 MG tablet Take 75 mg by mouth daily.   Yes [provider]  fluorometholone (FML) 0.1 % ophthalmic suspension Place 1 drop into both eyes 3 (three) times daily.   Yes [provider]  furosemide (LASIX) 20 MG tablet Take 1 tablet (20 mg total) by mouth 2 (two) times daily. 12/06/16  Yes Sudini, Wardell Heath, MD  Insulin Glargine (LANTUS SOLOSTAR) 100 UNIT/ML Solostar Pen Inject 28 Units into the skin at bedtime.   Yes [provider]  Insulin Glulisine (APIDRA SOLOSTAR) 100 UNIT/ML Solostar Pen Inject 12-14 Units into the skin See admin instructions. 14 units in the morning before breakfast then 14 units before lunch then 12 units before evening meal   Yes [provider]  ivabradine (CORLANOR) 7.5 MG TABS tablet Take 7.5 mg by mouth 2 (two) times daily with a meal.   Yes [provider]  levothyroxine (SYNTHROID) 25 MCG tablet Take 12.5 mcg by mouth daily before breakfast.    Yes [provider]  potassium chloride SA (K-DUR,KLOR-CON) 20 MEQ tablet Take 1 tablet (20  mEq total) by mouth daily. 11/28/16  Yes Mancel BaleWentz, Elliott, MD  rosuvastatin (CRESTOR) 20 MG tablet Take 20 mg by mouth at bedtime.   Yes [provider]  UNABLE TO FIND Vismed (0.18% Sodium Hyaluronate): Instill 1 drop into both eyes three times a day   Yes [provider]  NON FORMULARY ProCoralan 7.5 mg tablet twice daily.    [provider]  sacubitril-valsartan (ENTRESTO) 24-26 MG Take 1 tablet by mouth 2 (two) times daily.    [provider]    REVIEW OF SYSTEMS:  Review of Systems  Constitutional: Positive for  malaise/fatigue. Negative for chills, fever and weight loss.  HENT: Negative for ear pain, hearing loss and tinnitus.   Eyes: Negative for blurred vision, double vision, pain and redness.  Respiratory: Positive for shortness of breath. Negative for cough and hemoptysis.   Cardiovascular: Positive for orthopnea and leg swelling. Negative for chest pain and palpitations.  Gastrointestinal: Negative for abdominal pain, constipation, diarrhea, nausea and vomiting.  Genitourinary: Negative for dysuria, frequency and hematuria.  Musculoskeletal: Negative for back pain, joint pain and neck pain.  Skin:       No acne, rash, or lesions  Neurological: Negative for dizziness, tremors, focal weakness and weakness.  Endo/Heme/Allergies: Negative for polydipsia. Does not bruise/bleed easily.  Psychiatric/Behavioral: Negative for depression. The patient is not nervous/anxious and does not have insomnia.      VITAL SIGNS:   Vitals:   12/20/16 2300 12/21/16 0000 12/21/16 0030 12/21/16 0100  BP: (!) 82/56 93/60 (!) 101/59 (!) 92/54  Pulse: 62 67 72 61  Resp: 14 11 17 15   Temp:      TempSrc:      SpO2: 96% 99% 96% 99%  Weight:      Height:       Wt Readings from Last 3 Encounters:  12/20/16 60.1 kg (132 lb 8 oz)  12/16/16 57.2 kg (126 lb)  12/14/16 56.7 kg (125 lb)    PHYSICAL EXAMINATION:  Physical Exam  Vitals reviewed. Constitutional: She is oriented to person, place, and time. She appears well-developed and well-nourished. No distress.  HENT:  Head: Normocephalic and atraumatic.  Mouth/Throat: Oropharynx is clear and moist.  Eyes: Pupils are equal, round, and reactive to light. Conjunctivae and EOM are normal. No scleral icterus.  Neck: Normal range of motion. Neck supple. No JVD present. No thyromegaly present.  Cardiovascular: Normal rate, regular rhythm and intact distal pulses.  Exam reveals no gallop and no friction rub.   No murmur heard. Respiratory: Effort normal. No respiratory  distress. She has no wheezes. She has rales.  GI: Soft. Bowel sounds are normal. She exhibits no distension. There is no tenderness.  Musculoskeletal: Normal range of motion. She exhibits edema.  No arthritis, no gout  Lymphadenopathy:    She has no cervical adenopathy.  Neurological: She is alert and oriented to person, place, and time. No cranial nerve deficit.  No dysarthria, no aphasia  Skin: Skin is warm and dry. No rash noted. No erythema.  Psychiatric: She has a normal mood and affect. Her behavior is normal. Judgment and thought content normal.    LABORATORY PANEL:   CBC  Recent Labs Lab 12/20/16 2208  WBC 11.6*  HGB 11.6*  HCT 35.7  PLT 312   ------------------------------------------------------------------------------------------------------------------  Chemistries   Recent Labs Lab 12/20/16 2320  NA 140  K 3.6  CL 103  CO2 26  GLUCOSE 89  BUN 41*  CREATININE 1.21*  CALCIUM 8.6*  AST 46*  ALT 31  ALKPHOS 79  BILITOT 0.7   ------------------------------------------------------------------------------------------------------------------  Cardiac Enzymes  Recent Labs Lab 12/20/16 2320  TROPONINI 0.03*   ------------------------------------------------------------------------------------------------------------------  RADIOLOGY:  Dg Chest Portable 1 View  Result Date: 12/20/2016 CLINICAL DATA:  Shortness of breath with cough EXAM: PORTABLE CHEST 1 VIEW COMPARISON:  12/14/2016 FINDINGS: Mild cardiomegaly with vascular congestion. Mild diffuse interstitial prominence suggests mild edema. Streaky atelectasis at the right base, no change. No pneumothorax. IMPRESSION: Mild cardiomegaly with slight central congestion. Mild diffuse interstitial opacity could reflect minimal edema. Streaky atelectasis at the right base. Small right pleural effusion. Electronically Signed   By: Jasmine Pang M.D.   On: 12/20/2016 22:37    EKG:   Orders placed or  performed during the hospital encounter of 12/20/16  . ED EKG  . ED EKG  . EKG 12-Lead  . EKG 12-Lead    IMPRESSION AND PLAN:  Principal Problem:   Acute on chronic systolic heart failure (HCC) - continue home meds, one dose of IV Lasix given here in the ED, we'll have to use cautious diuresis as patient's blood pressure is borderline low. We will cycle her enzymes, get a cardiology consult Active Problems:   CAD (coronary artery disease) - continue home meds, holding some of her antihypertensive medications for now given her low blood pressure, though these are recently important for her to take and will need to be restarted once her pressure and come up a little   Diabetes (HCC) - sliding scale insulin with corresponding glucose checks   Hypothyroidism - home dose thyroid medication  All the records are reviewed and case discussed with ED provider. Management plans discussed with the patient and/or family.  DVT PROPHYLAXIS: SubQ lovenox  GI PROPHYLAXIS: None  ADMISSION STATUS: Inpatient  CODE STATUS: Full Code Status History    Date Active Date Inactive Code Status Order ID Comments User Context   12/04/2016  4:04 AM 12/06/2016  7:17 PM Full Code [id number]  Arnaldo Natal, MD ED      TOTAL TIME TAKING CARE OF THIS PATIENT: 45 minutes.   Viviano Bir FIELDING 12/21/2016, 1:16 AM  [facility]  Office  [phone]  CC: Primary care physician; Patient, No Pcp Per  Note:  This document was prepared using Dragon voice recognition software and may include unintentional dictation errors.

## 2016-12-21 NOTE — Progress Notes (Signed)
The patient is admitted for acute on chronic CHF this morning. Her vital signs are stable, not on oxygen this time. She feels better shortness breasts and no cough. Physical examinations is unremarkable except but basilar rales. I discussed with cardiology Dr. Mariah MillingGollan. Continue current treatment.  Clinical research associateArabic Interpreter utilized for communication.  Time spent 33 minutes.

## 2016-12-22 ENCOUNTER — Telehealth: Payer: Self-pay | Admitting: *Deleted

## 2016-12-22 LAB — BASIC METABOLIC PANEL
Anion gap: 14 (ref 5–15)
BUN: 42 mg/dL — ABNORMAL HIGH (ref 6–20)
CHLORIDE: 101 mmol/L (ref 101–111)
CO2: 25 mmol/L (ref 22–32)
Calcium: 8.8 mg/dL — ABNORMAL LOW (ref 8.9–10.3)
Creatinine, Ser: 1.66 mg/dL — ABNORMAL HIGH (ref 0.44–1.00)
GFR calc non Af Amer: 32 mL/min — ABNORMAL LOW (ref >60)
GFR, EST AFRICAN AMERICAN: 37 mL/min — AB (ref >60)
Glucose, Bld: 191 mg/dL — ABNORMAL HIGH (ref 65–99)
POTASSIUM: 3.9 mmol/L (ref 3.5–5.1)
SODIUM: 140 mmol/L (ref 135–145)

## 2016-12-22 LAB — MAGNESIUM: MAGNESIUM: 2 mg/dL (ref 1.7–2.4)

## 2016-12-22 LAB — HIV ANTIBODY (ROUTINE TESTING W REFLEX): HIV SCREEN 4TH GENERATION: NONREACTIVE

## 2016-12-22 LAB — GLUCOSE, CAPILLARY
GLUCOSE-CAPILLARY: 199 mg/dL — AB (ref 65–99)
GLUCOSE-CAPILLARY: 204 mg/dL — AB (ref 65–99)
Glucose-Capillary: 340 mg/dL — ABNORMAL HIGH (ref 65–99)

## 2016-12-22 MED ORDER — METOPROLOL SUCCINATE ER 25 MG PO TB24: 12.5000 mg | ORAL_TABLET | Freq: Every day | ORAL | Status: DC

## 2016-12-22 MED ORDER — ENOXAPARIN SODIUM 30 MG/0.3ML ~~LOC~~ SOLN: 30.0000 mg | SUBCUTANEOUS | Status: DC

## 2016-12-22 MED ORDER — TORSEMIDE 20 MG PO TABS: 20.0000 mg | ORAL_TABLET | Freq: Two times a day (BID) | ORAL | 1 refills | Status: DC

## 2016-12-22 MED ORDER — DIPHENHYDRAMINE HCL 25 MG PO CAPS: 25.0000 mg | ORAL_CAPSULE | Freq: Three times a day (TID) | ORAL | Status: DC | PRN

## 2016-12-22 MED ORDER — METOPROLOL SUCCINATE ER 25 MG PO TB24: 12.5000 mg | ORAL_TABLET | Freq: Every day | ORAL | 1 refills | Status: DC

## 2016-12-22 MED ORDER — TORSEMIDE 20 MG PO TABS: 20.0000 mg | ORAL_TABLET | Freq: Two times a day (BID) | ORAL | Status: DC

## 2016-12-22 MED ORDER — SPIRONOLACTONE 25 MG PO TABS: 12.5000 mg | ORAL_TABLET | Freq: Every day | ORAL | 1 refills | Status: DC

## 2016-12-22 MED ORDER — SACUBITRIL-VALSARTAN 24-26 MG PO TABS: 1.0000 | ORAL_TABLET | Freq: Two times a day (BID) | ORAL | 1 refills | Status: DC

## 2016-12-22 NOTE — Progress Notes (Signed)
Stephanie MilroyBeliah, Stephanie [name], patient has no acute event overnight. Video translator was used for assessment and medication teaching. Patient's family remained at bedside overnight.

## 2016-12-22 NOTE — Progress Notes (Signed)
Anticoagulation monitoring(Lovenox):  65yo  female ordered Lovenox 40 mg Q24h  Filed Weights   12/20/16 2214 12/21/16 0309 12/22/16 0500  Weight: 132 lb 8 oz (60.1 kg) 126 lb 11.2 oz (57.5 kg) 124 lb 6.4 oz (56.4 kg)   Body mass index is 25.13 kg/m.    Lab Results  Component Value Date   CREATININE 1.66 (H) 12/22/2016   CREATININE 1.19 (H) 12/21/2016   CREATININE 1.21 (H) 12/20/2016   Estimated Creatinine Clearance: 26.6 mL/min (A) (by C-G formula based on SCr of 1.66 mg/dL (H)). Hemoglobin & Hematocrit     Component Value Date/Time   HGB 9.7 (L) 12/21/2016 0517   HCT 30.6 (L) 12/21/2016 [id number]     Per Protocol for Patient with estCrcl < 30 ml/min and BMI < 40, will transition to Lovenox 30 mg Q24h.

## 2016-12-22 NOTE — Plan of Care (Signed)
Problem: Food- and Nutrition-Related Knowledge Deficit (NB-1.1) Goal: Nutrition education Formal process to instruct or train a patient/client in a skill or to impart knowledge to help patients/clients voluntarily manage or modify food choices and eating behavior to maintain or improve health. Outcome: Adequate for Discharge Nutrition Education Note  RD consulted for nutrition education regarding CHF.  Spoke with patient via interpreter [phone] Normally eats hummus, avocodo, sweet cheese for breakfast, will eat cooked meals with family for lunch and dinner with no salt. Denies eating processed foods or consuming drinks that typically have significant amounts of salt in them. States she has been eating with no salt for 7 months.  RD provided "Low Sodium Nutrition Therapy" handout from the Academy of [employer] and Dietetics. Reviewed patient's dietary recall. Provided examples on ways to decrease sodium intake in diet. Discouraged intake of processed foods and use of salt shaker. Encouraged fresh fruits and vegetables as well as whole grain sources of carbohydrates to maximize fiber intake.   RD discussed why it is important for patient to adhere to diet recommendations, and emphasized the role of fluids, foods to avoid, and importance of weighing self daily. Teach back method used.  Expect fair compliance.  Body mass index is 25.13 kg/m. Pt meets criteria for overweight based on current BMI.  Current diet order is heart healthy/carb modified, patient is consuming approximately 50-60% of meals at this time. Labs and medications reviewed. No further nutrition interventions warranted at this time. RD contact information provided. If additional nutrition issues arise, please re-consult RD.   Dionne AnoWilliam M. Cherisse Carrell, MS, RD LDN Inpatient Clinical Dietitian Pager [phone]

## 2016-12-22 NOTE — Telephone Encounter (Signed)
Admitted

## 2016-12-22 NOTE — Progress Notes (Signed)
Went over discharge instructions with the patient and family using interpreter cart about medications and follow-up appointment. Discontinue peripheral IV and telemetry monitor. Stephanie [name] to help patient for transport.

## 2016-12-22 NOTE — Progress Notes (Signed)
Provided patient with "Living Better with Heart Failure" packet. Briefly reviewed definition of heart failure and signs and symptoms of an exacerbation. Reviewed importance of and reason behind checking weight daily in the AM, after using the bathroom, but before getting dressed. Discussed when to call the Dr= weight gain of >2lb overnight of 5lb in a week,  Discussed yellow zone= call MD: weight gain of >2lb overnight of 5lb in a week, increased swelling, increased SOB when lying down, chest discomfort, dizziness, increased fatigue Red Zone= call 911: struggle to breath, fainting or near fainting, significant chest pain Reviewed low sodium diet <2g/day-provided handout of recommended and not recommended foods  Fluid restriction <2L/day Reviewed how to read nutrition label Reviewed medication changes: Explained briefly why pt is on the medications (either make you feel better, live longer or keep you out of the hospital) and discussed monitoring and side effects  Discussed tobacco cessation: Patient is non-smoker  Discussed exercise: Patient's ability to exercise is limited but encouraged exercise within reasonable limits

## 2016-12-22 NOTE — Discharge Summary (Signed)
[facility] [place] at [facility]   PATIENT NAME: Stephanie [name]    MR#:  [id number]  DATE OF BIRTH:  [birth year]  DATE OF ADMISSION:  12/20/2016 ADMITTING PHYSICIAN: Oralia Manisavid Willis, MD  DATE OF DISCHARGE: 12/22/2016  PRIMARY CARE PHYSICIAN: Patient, No Pcp Per    ADMISSION DIAGNOSIS:  Angina at rest [facility](HCC) [I20.8] Acute on chronic congestive heart failure, unspecified heart failure type (HCC) [I50.9]  DISCHARGE DIAGNOSIS:  Principal Problem:   Acute on chronic systolic heart failure (HCC) Active Problems:   Hypothyroidism   CAD (coronary artery disease)   Diabetes (HCC)   Acute on chronic systolic CHF (congestive heart failure) (HCC)   SECONDARY DIAGNOSIS:   Past Medical History:  Diagnosis Date  . CHF (congestive heart failure) (HCC)   . Diabetes mellitus without complication (HCC)   . Hyperlipidemia   . Hypertension   . MI (myocardial infarction) (HCC)   . Thyroid disease     HOSPITAL COURSE:   66 year old female with past medical history of congestive heart failure, hypertension, hyperlipidemia, diabetes, previous history of MI, hypothyroidism presented to the hospital due to shortness of breath.  1. CHF-acute on chronic systolic dysfunction. Patient was admitted to the hospital on diuresis with IV Lasix. -With IV diuresis patient's volume status has improved. Her creatinine started to rise and therefore patient's diuretics were changed. Initially patient was placed on Lasix but then now switched over to oral torsemide which she is now being discharged on. -Patient has been taken off the carvedilol and now being discharged on oral Toprol. She will continue her Entresto, low-dose Aldactone. - pt. Has been taken off her Corlanor as per Cardiology.  - Cardiology (Dr. Kirke CorinArida) to cont. To follow her as outpatient. Pt. Will follow up at the [place]F clinic in [place] as she may benefit from a LVAD as per Cardiology.   2. Diabetes type 2 without  complication-patient will continue her Apidra insulin.  3. Hypothyroidism-patient will continue her Synthroid.  4. Gout-no acute attack-patient will continue allopurinol.  5. Hyperlipidemia-patient will continue her Crestor.  DISCHARGE CONDITIONS:   Stable  CONSULTS OBTAINED:  Treatment Team:  Antonieta IbaGollan, Timothy J, MD  DRUG ALLERGIES:  No Known Allergies  DISCHARGE MEDICATIONS:   Allergies as of 12/22/2016   No Known Allergies     Medication List    STOP taking these medications   carvedilol 6.25 MG tablet Commonly known as:  COREG   CORLANOR 7.5 MG Tabs tablet Generic drug:  ivabradine   furosemide 20 MG tablet Commonly known as:  LASIX   NON FORMULARY     TAKE these medications   allopurinol 100 MG tablet Commonly known as:  ZYLOPRIM Take 100 mg by mouth daily.   APIDRA SOLOSTAR 100 UNIT/ML Solostar Pen Generic drug:  Insulin Glulisine Inject 12-14 Units into the skin See admin instructions. 14 units in the morning before breakfast then 14 units before lunch then 12 units before evening meal   aspirin 81 MG chewable tablet Chew 81 mg by mouth daily.   clopidogrel 75 MG tablet Commonly known as:  PLAVIX Take 75 mg by mouth daily.   ENTRESTO 24-26 MG Generic drug:  sacubitril-valsartan Take 1 tablet by mouth 2 (two) times daily.   fluorometholone 0.1 % ophthalmic suspension Commonly known as:  FML Place 1 drop into both eyes 3 (three) times daily.   LANTUS SOLOSTAR 100 UNIT/ML Solostar Pen Generic drug:  Insulin Glargine Inject 28 Units into the skin at bedtime.   metoprolol  succinate 25 MG 24 hr tablet Commonly known as:  TOPROL-XL Take 0.5 tablets (12.5 mg total) by mouth daily.   potassium chloride SA 20 MEQ tablet Commonly known as:  K-DUR,KLOR-CON Take 1 tablet (20 mEq total) by mouth daily.   rosuvastatin 20 MG tablet Commonly known as:  CRESTOR Take 20 mg by mouth at bedtime.   spironolactone 25 MG tablet Commonly known as:   ALDACTONE Take 0.5 tablets (12.5 mg total) by mouth daily.   SYNTHROID 25 MCG tablet Generic drug:  levothyroxine Take 12.5 mcg by mouth daily before breakfast.   torsemide 20 MG tablet Commonly known as:  DEMADEX Take 1 tablet (20 mg total) by mouth 2 (two) times daily.   UNABLE TO FIND Vismed (0.18% Sodium Hyaluronate): Instill 1 drop into both eyes three times a day         DISCHARGE INSTRUCTIONS:   DIET:  Cardiac diet  DISCHARGE CONDITION:  Stable  ACTIVITY:  Activity as tolerated  OXYGEN:  Home Oxygen: No.   Oxygen Delivery: room air  DISCHARGE LOCATION:  home   If you experience worsening of your admission symptoms, develop shortness of breath, life threatening emergency, suicidal or homicidal thoughts you must seek medical attention immediately by calling 911 or calling your MD immediately  if symptoms less severe.  You Must read complete instructions/literature along with all the possible adverse reactions/side effects for all the Medicines you take and that have been prescribed to you. Take any new Medicines after you have completely understood and accpet all the possible adverse reactions/side effects.   Please note  You were cared for by a hospitalist during your hospital stay. If you have any questions about your discharge medications or the care you received while you were in the hospital after you are discharged, you can call the unit and asked to speak with the hospitalist on call if the hospitalist that took care of you is not available. Once you are discharged, your primary care physician will handle any further medical issues. Please note that NO REFILLS for any discharge medications will be authorized once you are discharged, as it is imperative that you return to your primary care physician (or establish a relationship with a primary care physician if you do not have one) for your aftercare needs so that they can reassess your need for medications and  monitor your lab values.     Today   Shortness of breath, abdominal bloating Improved.  No dizziness, N/V or chest pain. Pt. Seen with the help of an Arabic interpreter.   VITAL SIGNS:  Blood pressure (!) 90/50, pulse 64, temperature 98 F (36.7 C), temperature source Oral, resp. rate 18, height 4\' 11"  (1.499 m), weight 56.4 kg (124 lb 6.4 oz), SpO2 94 %.  I/O:    Intake/Output Summary (Last 24 hours) at 12/22/16 1607 Last data filed at 12/22/16 1445  Gross per 24 hour  Intake              360 ml  Output             1200 ml  Net             -840 ml    PHYSICAL EXAMINATION:  GENERAL:  66 y.o.-year-old arabic speaking patient lying in the bed in no acute distress.  EYES: Pupils equal, round, reactive to light and accommodation. No scleral icterus. Extraocular muscles intact.  HEENT: Head atraumatic, normocephalic. Oropharynx and nasopharynx clear.  NECK:  Supple, no  jugular venous distention. No thyroid enlargement, no tenderness.  LUNGS: Normal breath sounds bilaterally, no wheezing, rales,rhonchi. No use of accessory muscles of respiration.  CARDIOVASCULAR: S1, S2 normal. No murmurs, rubs, or gallops.  ABDOMEN: Soft, non-tender, non-distended. Bowel sounds present. No organomegaly or mass.  EXTREMITIES: No pedal edema, cyanosis, or clubbing.  NEUROLOGIC: Cranial nerves II through XII are intact. No focal motor or sensory defecits b/l.  PSYCHIATRIC: The patient is alert and oriented x 3. Good affect.  SKIN: No obvious rash, lesion, or ulcer.   DATA REVIEW:   CBC  Recent Labs Lab 12/21/16 0517  WBC 9.6  HGB 9.7*  HCT 30.6*  PLT 250    Chemistries   Recent Labs Lab 12/20/16 2320  12/22/16 0429  NA 140  < > 140  K 3.6  < > 3.9  CL 103  < > 101  CO2 26  < > 25  GLUCOSE 89  < > 191*  BUN 41*  < > 42*  CREATININE 1.21*  < > 1.66*  CALCIUM 8.6*  < > 8.8*  MG  --   --  2.0  AST 46*  --   --   ALT 31  --   --   ALKPHOS 79  --   --   BILITOT 0.7  --   --   <  > = values in this interval not displayed.  Cardiac Enzymes  Recent Labs Lab 12/21/16 1730  TROPONINI 0.03*    Microbiology Results  No results found for this or any previous visit.  RADIOLOGY:  Dg Chest Portable 1 View  Result Date: 12/20/2016 CLINICAL DATA:  Shortness of breath with cough EXAM: PORTABLE CHEST 1 VIEW COMPARISON:  12/14/2016 FINDINGS: Mild cardiomegaly with vascular congestion. Mild diffuse interstitial prominence suggests mild edema. Streaky atelectasis at the right base, no change. No pneumothorax. IMPRESSION: Mild cardiomegaly with slight central congestion. Mild diffuse interstitial opacity could reflect minimal edema. Streaky atelectasis at the right base. Small right pleural effusion. Electronically Signed   By: Jasmine Pang M.D.   On: 12/20/2016 22:37      Management plans discussed with the patient, family and they are in agreement.  CODE STATUS:     Code Status Orders        Start     Ordered   12/21/16 0330  Full code  Continuous     12/21/16 0329    Code Status History    Date Active Date Inactive Code Status Order ID Comments User Context   12/04/2016  4:04 AM 12/06/2016  7:17 PM Full Code [id number]  Arnaldo Natal, MD ED      TOTAL TIME TAKING CARE OF THIS PATIENT: 40 minutes.    Houston Siren M.D on 12/22/2016 at 4:07 PM  Between 7am to 6pm - Pager - [phone]  After 6pm go to [email or web address] - Social research officer, government  [facility]  Office  [phone]  CC: Primary care physician; Patient, No Pcp Per

## 2016-12-22 NOTE — Progress Notes (Signed)
Talked to Dr. Anne HahnWillis about [facility]. Patient is from [place] and run off of medication, MD will give prescription. RN will continue to monitor.

## 2016-12-22 NOTE — Progress Notes (Signed)
Inpatient Diabetes Program Recommendations  AACE/ADA: New Consensus Statement on Inpatient Glycemic Control (2015)  Target Ranges:  Prepandial:   less than 140 mg/dL      Peak postprandial:   less than 180 mg/dL (1-2 hours)      Critically ill patients:  140 - 180 mg/dL   Lab Results  Component Value Date   GLUCAP 340 (H) 12/22/2016   HGBA1C 7.7 (H) 12/04/2016   Results for Stephanie [name], [name] (MRN [id number]) as of 12/22/2016 14:16  Ref. Range 12/21/2016 11:45 12/21/2016 16:31 12/21/2016 21:20 12/22/2016 07:53 12/22/2016 11:44  Glucose-Capillary Latest Ref Range: 65 - 99 mg/dL 409196 (H) 811145 (H) 914177 (H) 199 (H) 340 (H)   Diabetes history: Type 2  Outpatient Diabetes medications: Lantus 28 units daily, Apidra 14 units breakfast/14 units lunch/12 units supper Current orders for Inpatient glycemic control:  Novolog sensitive tid with meals and HS Inpatient Diabetes Program Recommendations:   Please consider restarting a portion of patient's basal insulin.  Consider adding Lantus 12 units daily.  Also consider adding Novolog meal coverage 4 units tid with meals (hold if patient eats less than 50%).  Thanks, Beryl MeagerJenny Hensley Aziz, RN, BC-ADM Inpatient Diabetes Coordinator Pager [phone] (8a-5p)

## 2016-12-22 NOTE — Telephone Encounter (Signed)
-----   Message from Coralee RudSabrina F Gilley sent at 12/22/2016  1:06 PM EDT ----- Regarding: tcm/ph 11/9 2:30 Nicolasa Duckinghristopher Berge, NP

## 2016-12-22 NOTE — Discharge Instructions (Signed)
Heart Failure Clinic appointment on January 02 2017 at 11:40am with Stephanie Kindredina Peg Fifer, FNP. Please call [phone] to reschedule.

## 2016-12-22 NOTE — Care Management (Signed)
Spoke with patient and daughter through interpretor.  Patient will remain in the states for at least another 5 months.  Is paying for her medications.  Has 2 months of Entresto but will need another prescription before she returns home to [place].  Provided with a 30 day coupon because has not ever used one and provided with patient assistance application so can investigate whether patient would meet criteria.  Provided information regarding Good Rx.  She is followed by Dr Kirke CorinArida.  Patient and daughter verbalize they have no concerns regarding medications

## 2016-12-22 NOTE — Progress Notes (Signed)
Progress Note  Patient Name: Stephanie [name] Date of Encounter: 12/22/2016  Primary Cardiologist: Kirke Corin  Subjective   Rounds were done with the assistance of the medical video interpreter. She reports improved SOB and less abdominal fullness, though still feels like there is some fluid retention. SCr bumped from 1.19-->1.6 overnight. Minimal documented UOP, remains net + per notes. Weight down 2 pounds from 126-->124. No chest pain.   Inpatient Medications    Scheduled Meds: . aspirin  81 mg Oral Daily  . clopidogrel  75 mg Oral Daily  . enoxaparin (LOVENOX) injection  40 mg Subcutaneous Q24H  . fluorometholone  1 drop Both Eyes TID  . insulin aspart  0-5 Units Subcutaneous QHS  . insulin aspart  0-9 Units Subcutaneous TID WC  . levothyroxine  12.5 mcg Oral QAC breakfast  . metoprolol succinate  12.5 mg Oral Daily  . rosuvastatin  20 mg Oral QHS  . sacubitril-valsartan  1 tablet Oral BID  . spironolactone  12.5 mg Oral Daily  . [START ON 12/23/2016] torsemide  20 mg Oral BID   Continuous Infusions:  PRN Meds: acetaminophen **OR** acetaminophen, diphenhydrAMINE, ondansetron **OR** ondansetron (ZOFRAN) IV   Vital Signs    Vitals:   12/21/16 1959 12/22/16 0431 12/22/16 0500 12/22/16 0934  BP: (!) 82/54 (!) 98/47  (!) 90/50  Pulse: 62 68  64  Resp:  18  18  Temp: 98.6 F (37 C) 98.4 F (36.9 C)  98 F (36.7 C)  TempSrc:  Oral  Oral  SpO2: 99% 97%  94%  Weight:   124 lb 6.4 oz (56.4 kg)   Height:        Intake/Output Summary (Last 24 hours) at 12/22/16 1118 Last data filed at 12/22/16 1006  Gross per 24 hour  Intake             1330 ml  Output             1200 ml  Net              130 ml   Filed Weights   12/20/16 2214 12/21/16 0309 12/22/16 0500  Weight: 132 lb 8 oz (60.1 kg) 126 lb 11.2 oz (57.5 kg) 124 lb 6.4 oz (56.4 kg)    Telemetry    NSR - Personally Reviewed  ECG    n/a - Personally Reviewed  Physical Exam   GEN: No acute distress.   Neck:  No JVD. Cardiac: RRR, no murmurs, rubs, or gallops.  Respiratory: Clear to auscultation bilaterally.  GI: Soft, nontender, non-distended.   MS: No edema; No deformity. Neuro:  Alert and oriented x 3; Nonfocal.  Psych: Normal affect.  Labs    Chemistry Recent Labs Lab 12/20/16 2320 12/21/16 0517 12/22/16 0429  NA 140 143 140  K 3.6 3.4* 3.9  CL 103 106 101  CO2 26 27 25   GLUCOSE 89 126* 191*  BUN 41* 38* 42*  CREATININE 1.21* 1.19* 1.66*  CALCIUM 8.6* 8.4* 8.8*  PROT 6.4*  --   --   ALBUMIN 3.6  --   --   AST 46*  --   --   ALT 31  --   --   ALKPHOS 79  --   --   BILITOT 0.7  --   --   GFRNONAA 47* 48* 32*  GFRAA 54* 55* 37*  ANIONGAP 11 10 14      Hematology Recent Labs Lab 12/20/16 2208 12/21/16 0517  WBC 11.6* 9.6  RBC 4.42 3.79*  HGB 11.6* 9.7*  HCT 35.7 30.6*  MCV 80.7 80.7  MCH 26.2 25.7*  MCHC 32.5 31.8*  RDW 16.9* 16.4*  PLT 312 250    Cardiac Enzymes Recent Labs Lab 12/20/16 2320 12/21/16 0517 12/21/16 1102 12/21/16 1730  TROPONINI 0.03* 0.04* 0.03* 0.03*   No results for input(s): TROPIPOC in the last 168 hours.   BNP Recent Labs Lab 12/20/16 2320  BNP 1,684.0*     DDimer No results for input(s): DDIMER in the last 168 hours.   Radiology    Dg Chest Portable 1 View  Result Date: 12/20/2016 IMPRESSION: Mild cardiomegaly with slight central congestion. Mild diffuse interstitial opacity could reflect minimal edema. Streaky atelectasis at the right base. Small right pleural effusion. Electronically Signed   By: Jasmine Pang M.D.   On: 12/20/2016 22:37    Cardiac Studies   TTE 12/04/2016: Study Conclusions  - Left ventricle: Systolic function was severely reduced. The estimated ejection fraction was in the range of 20% to 25%. - Regional wall motion abnormality: Akinesis of the mid anteroseptal myocardium; moderate hypokinesis of the mid anterior and mid anterolateral myocardium.  Patient Profile     66 y.o. female  with history of CAD s/p PCI fo the LAD and RCA in the setting of a late presenting MI in 05/2016, ICM/chronic systolic CHF with EF of 20-25% on optimal medical therapy as BP allows though unable to titrate due to BP, DM2, HTN, HLD, and hypothyroidism who is being seen today for the evaluation of recurrent acute on chronic systolic CHF/ICM at the request of Dr. Anne Hahn, MD.  Assessment & Plan    1. Acute on chronic systolic CHF/ICM: -Volume status subjectively and objectively improved -Weight down 2 pounds -Given bump in renal function would hold further IV Lasix today and place her on torsemide 20 mg bid starting on 10/26 -Discussed possible milrinone gtt with MD -Stop Corlanor  -Change Coreg to Toprol XL 12.5 mg daily as this may have less BP drop -Continue Entresto 24/26 mg bid -Continue spironolactone 12.5 mg daily -She will need to space out her medications throughout the day in an effort to not drop her BP  -Would ideally benefit from an LVAD though this is difficult for her given her social situation -Recommend referral to Dr. Gala Romney in [place] as an outpatient -Daily weights with strict Is and Os  2. Elevated troponinCAD: -Troponin minimally elevated with a peak of 0.04 -Likely supply demand ischemia in the setting of underlying CAD with volume overload and hypotension  -No plans for inpatient ischemic evaluation at this time -Continue DAPT with ASA and Plavix   3. Hypotension: -Has historically precluded addition or titration of HF medications -Attempt to restart medications per MD -Hold parameters added  4. HLD: -Crestor  For questions or updates, please contact [facility] Please consult [email or web address] for contact info under Cardiology/STEMI.    Signed, Eula Listen, PA-C [facility] Pager: [phone] 12/22/2016, 11:18 AM   Attending Note Patient seen and examined, agree with detailed note above,  Patient presentation and plan discussed on rounds.    Long discussion with patient this morning with interpreter services Abdominal bloating has improved She is still worried about fluid in her abdomen and wondering if we should do some kind of imaging to look for fluid Reports she is always dizzy even before this admission She is here visiting from the country of [place], no insurance  She reports some urination on IV Lasix, she  had several doses but not much  On physical exam JVD of 10, lungs clear to auscultation bilaterally, heart sounds regular normal S1-S2 no murmurs appreciated, abdomen soft nontender, no significant lower extremity edema  Lab work reviewed showing creatinine 1.66, BUN 42, potassium 3.9 Creatinine yesterday 1.2 with BUN 38  A/P: 1) chronic systolic CHF Minimal IV Lasix with bump in creatinine concerning for prerenal state We will hold Lasix today Start torsemide 20 twice daily at discharge Extra torsemide as needed for shortness of breath Medications are challenging given persistent hypotension She reports chronic dizziness at home, improved with supine position Consider alternate regiment such as metoprolol succinate 12.5 mg daily, Entresto low-dose possibly once a day rather than twice a day We will add Aldactone Consider holding the corlanor for now.  Uncertain if this is suppressing her heart rate and making her heart failure worse And could restart corlanor as an outpatient if needed Recommended she follow-up with Dr. Kirke CorinArida is able to communicate with her  Greater than 50% was spent in counseling and coordination of care with patient Total encounter time 35 minutes or more   Signed: Dossie Arbourim Gollan  M.D., Ph.D. [facility]

## 2016-12-23 NOTE — Telephone Encounter (Signed)
[facility].  Patient understands to follow up with provider Stephanie Givenshris Berge NP on 12/31/16 at 2:30PM at [facility].  Patients emergency contact confirmed follow up appointment information and verbalized understanding to have them call if they should have any questions regarding discharge instructions or medications.

## 2016-12-26 ENCOUNTER — Telehealth: Payer: Self-pay | Admitting: Cardiovascular Disease

## 2016-12-26 NOTE — Telephone Encounter (Signed)
Patient assistance application placed in nurse box for entresto

## 2016-12-26 NOTE — Telephone Encounter (Signed)
Patient calling the office for samples of medication:   1.  What medication and dosage are you requesting samples for?  Entresto 50 mg po daily   2.  Are you currently out of this medication? Yes

## 2016-12-26 NOTE — Telephone Encounter (Signed)
Patient applied for assistance with [facility] and they need a written rx for each of the patients medications   Please send to [facility].

## 2016-12-26 NOTE — Telephone Encounter (Signed)
Placed 1 box Entresto 24-36 mg tablet box and 1 month free supply card at front desk for pickup.

## 2016-12-27 ENCOUNTER — Other Ambulatory Visit: Payer: Self-pay

## 2016-12-27 MED ORDER — SACUBITRIL-VALSARTAN 24-26 MG PO TABS: 1.0000 | ORAL_TABLET | Freq: Two times a day (BID) | ORAL | 3 refills | Status: DC

## 2016-12-27 MED ORDER — METOPROLOL SUCCINATE ER 25 MG PO TB24
12.5000 mg | ORAL_TABLET | Freq: Every day | ORAL | 3 refills | Status: DC
Start: 2016-12-27 — End: 2016-12-29

## 2016-12-27 MED ORDER — TORSEMIDE 20 MG PO TABS: 20.0000 mg | ORAL_TABLET | Freq: Two times a day (BID) | ORAL | 3 refills | Status: DC

## 2016-12-27 MED ORDER — SPIRONOLACTONE 25 MG PO TABS: 12.5000 mg | ORAL_TABLET | Freq: Every day | ORAL | 3 refills | Status: DC

## 2016-12-27 NOTE — Telephone Encounter (Signed)
[facility] received incomplete. S[facility]w Rita at [facility] who [facility] but will be 6-8 [facility].  She inquires if pt could be prescribed another medication in the meantime. Will review with Dr. Kirke CorinArida.

## 2016-12-27 NOTE — Telephone Encounter (Signed)
Per Dr. Kirke CorinArida, pt's family states she brought enough Entresto with her from [place]. Notified Rita.

## 2016-12-27 NOTE — Telephone Encounter (Signed)
Reviewed with Dr. Kirke CorinArida. Prescriptions printed, signed and at front desk for pick up. Notified Rita at [facility] who will [facility].

## 2016-12-29 ENCOUNTER — Ambulatory Visit: Payer: Self-pay | Admitting: Adult Health Nurse Practitioner

## 2016-12-29 ENCOUNTER — Other Ambulatory Visit: Payer: Self-pay | Admitting: Adult Health Nurse Practitioner

## 2016-12-29 VITALS — BP 105/68 | HR 88 | Temp 97.9°F | Wt 125.1 lb

## 2016-12-29 DIAGNOSIS — E039 Hypothyroidism, unspecified: Secondary | ICD-10-CM

## 2016-12-29 DIAGNOSIS — Z794 Long term (current) use of insulin: Principal | ICD-10-CM

## 2016-12-29 DIAGNOSIS — E1159 Type 2 diabetes mellitus with other circulatory complications: Secondary | ICD-10-CM

## 2016-12-29 DIAGNOSIS — I509 Heart failure, unspecified: Secondary | ICD-10-CM

## 2016-12-29 DIAGNOSIS — E785 Hyperlipidemia, unspecified: Secondary | ICD-10-CM

## 2016-12-29 DIAGNOSIS — H9193 Unspecified hearing loss, bilateral: Secondary | ICD-10-CM

## 2016-12-29 MED ORDER — POTASSIUM CHLORIDE CRYS ER 20 MEQ PO TBCR: 20.0000 meq | EXTENDED_RELEASE_TABLET | Freq: Every day | ORAL | 4 refills | Status: AC

## 2016-12-29 MED ORDER — METOPROLOL SUCCINATE ER 25 MG PO TB24: 12.5000 mg | ORAL_TABLET | Freq: Every day | ORAL | 3 refills | Status: AC

## 2016-12-29 MED ORDER — ALLOPURINOL 100 MG PO TABS: 100.0000 mg | ORAL_TABLET | Freq: Every day | ORAL | 4 refills | Status: AC

## 2016-12-29 MED ORDER — TORSEMIDE 20 MG PO TABS: 20.0000 mg | ORAL_TABLET | Freq: Two times a day (BID) | ORAL | 3 refills | Status: AC

## 2016-12-29 MED ORDER — LEVOTHYROXINE SODIUM 25 MCG PO TABS: 12.5000 ug | ORAL_TABLET | Freq: Every day | ORAL | 4 refills | Status: AC

## 2016-12-29 MED ORDER — INSULIN GLARGINE 100 UNIT/ML SOLOSTAR PEN: 28.0000 [IU] | PEN_INJECTOR | Freq: Every day | SUBCUTANEOUS | 4 refills | Status: DC

## 2016-12-29 MED ORDER — ROSUVASTATIN CALCIUM 20 MG PO TABS: 20.0000 mg | ORAL_TABLET | Freq: Every day | ORAL | 4 refills | Status: AC

## 2016-12-29 MED ORDER — SPIRONOLACTONE 25 MG PO TABS: 12.5000 mg | ORAL_TABLET | Freq: Every day | ORAL | 3 refills | Status: AC

## 2016-12-29 MED ORDER — CLOPIDOGREL BISULFATE 75 MG PO TABS: 75.0000 mg | ORAL_TABLET | Freq: Every day | ORAL | 4 refills | Status: AC

## 2016-12-29 NOTE — Progress Notes (Signed)
Patient: Stephanie [name] Female    DOB: 02-06-1951   66 y.o.   MRN: [id number] Visit Date: 12/29/2016  Today's Provider: Jacelyn Pi, NP   No chief complaint on file.  Subjective:    HPI  Pt speaks Arabic, interpreter line used, family present in the room.    Pt hospitalized on 10.23 for acute on chronic CHF.  On Torsemide, Entresto and Aldactone.  To FU with CHF clinic on 11.5 and a patient of Dr. Kirke Corin (Cardiology). Potassium once daily. Last K level 3.9  From Cardiology note: She underwent PCI of the LAD and right coronary artery.  In spite of that, her ejection fraction continued to be 25%.  She had cardiac catheterization done in September of this year in [place].  The report was reviewed and indicated patent stents in the LAD and right coronary artery with ejection fraction of 25%. The patient has been having recurrent heart failure exacerbations with volume overload.  There has been difficulty in up titrating heart failure medications due to hypotension.   Pt is weighing daily, denies any large weight gains over the past week. Denies dyspnea.   On Plavix: reports no acute bleeding.   HLD:  Taking rouvastatin as directed.   DM:  Last A1c 7.7 in October.  Taking Lantus: 28 units at night.  Taking Apidra 14 units with breakfast and lunch, 12 units at dinner.  WUJ:WJXBJYNWG 100- not taking on a regular basis.   Family states that patient has had progressive hearing loss, saw a MD in [place] who cleaned her ears and this did not help.   Hypothyroidism:  Taking medication as directed.  Last TSH: 3.604     No Known Allergies Previous Medications   ALLOPURINOL (ZYLOPRIM) 100 MG TABLET    Take 100 mg by mouth daily.   ASPIRIN 81 MG CHEWABLE TABLET    Chew 81 mg by mouth daily.    CLOPIDOGREL (PLAVIX) 75 MG TABLET    Take 75 mg by mouth daily.   FLUOROMETHOLONE (FML) 0.1 % OPHTHALMIC SUSPENSION    Place 1 drop into both eyes 3 (three) times daily.   INSULIN GLARGINE  (LANTUS SOLOSTAR) 100 UNIT/ML SOLOSTAR PEN    Inject 28 Units into the skin at bedtime.   INSULIN GLULISINE (APIDRA SOLOSTAR) 100 UNIT/ML SOLOSTAR PEN    Inject 12-14 Units into the skin See admin instructions. 14 units in the morning before breakfast then 14 units before lunch then 12 units before evening meal   LEVOTHYROXINE (SYNTHROID) 25 MCG TABLET    Take 12.5 mcg by mouth daily before breakfast.    METOPROLOL SUCCINATE (TOPROL-XL) 25 MG 24 HR TABLET    Take 0.5 tablets (12.5 mg total) by mouth daily.   POTASSIUM CHLORIDE SA (K-DUR,KLOR-CON) 20 MEQ TABLET    Take 1 tablet (20 mEq total) by mouth daily.   ROSUVASTATIN (CRESTOR) 20 MG TABLET    Take 20 mg by mouth at bedtime.   SACUBITRIL-VALSARTAN (ENTRESTO) 24-26 MG    Take 1 tablet by mouth 2 (two) times daily.   SACUBITRIL-VALSARTAN (ENTRESTO) 24-26 MG    Take 1 tablet by mouth 2 (two) times daily.   SPIRONOLACTONE (ALDACTONE) 25 MG TABLET    Take 0.5 tablets (12.5 mg total) by mouth daily.   TORSEMIDE (DEMADEX) 20 MG TABLET    Take 1 tablet (20 mg total) by mouth 2 (two) times daily.   UNABLE TO FIND    Vismed (0.18% Sodium Hyaluronate): Instill 1 drop into both eyes  three times a day    Review of Systems  All other systems reviewed and are negative.   Social History  Substance Use Topics  . Smoking status: Never Smoker  . Smokeless tobacco: Never Used  . Alcohol use No   Objective:   BP 105/68   Pulse 88   Temp 97.9 F (36.6 C)   Wt 125 lb 1.6 oz (56.7 kg)   BMI 25.27 kg/m   Physical Exam  Constitutional: She is oriented to person, place, and time. She appears well-developed and well-nourished.  HENT:  Head: Normocephalic and atraumatic.  Eyes: Pupils are equal, round, and reactive to light.  Neck: Normal range of motion. Neck supple.  Cardiovascular: Normal rate, regular rhythm, normal heart sounds and intact distal pulses.   Pulmonary/Chest: Effort normal and breath sounds normal. She has no rales.  Abdominal: Soft.  Bowel sounds are normal.  Musculoskeletal: She exhibits no edema.  Neurological: She is alert and oriented to person, place, and time.  Skin: Skin is warm and dry.  Vitals reviewed.       Assessment & Plan:         DM: .  Not controlled.  Encourage diabetic diet and exercise.  Continue current medication regimen.  Check CBG TID.  Glucometer given.   HLD:  Lipid panel today.   Continue current regimen.  Encourage low cholesterol, low fat diet and exercise.   Hypothyroidism:  Continue current therapy.   CHF:  FU with HF clinic as scheduled.  Continue current therapy.  ENT referral for hearing loss.     Jacelyn Pieah Doles-Johnson, NP   [facility]

## 2016-12-29 NOTE — Patient Instructions (Signed)
nnnnnnnnn Heart Failure Heart failure means your heart has trouble pumping blood. This makes it hard for your body to work well. Heart failure is usually a long-term (chronic) condition. You must take good care of yourself and follow your doctor's treatment plan. Follow these instructions at home:  Take your heart medicine as told by your doctor. ? Do not stop taking medicine unless your doctor tells you to. ? Do not skip any dose of medicine. ? Refill your medicines before they run out. ? Take other medicines only as told by your doctor or pharmacist.  Stay active if told by your doctor. The elderly and people with severe heart failure should talk with a doctor about physical activity.  Eat heart-healthy foods. Choose foods that are without trans fat and are low in saturated fat, cholesterol, and salt (sodium). This includes fresh or frozen fruits and vegetables, fish, lean meats, fat-free or low-fat dairy foods, whole grains, and high-fiber foods. Lentils and dried peas and beans (legumes) are also good choices.  Limit salt if told by your doctor.  Cook in a healthy way. Roast, grill, broil, bake, poach, steam, or stir-fry foods.  Limit fluids as told by your doctor.  Weigh yourself every morning. Do this after you pee (urinate) and before you eat breakfast. Write down your weight to give to your doctor.  Take your blood pressure and write it down if your doctor tells you to.  Ask your doctor how to check your pulse. Check your pulse as told.  Lose weight if told by your doctor.  Stop smoking or chewing tobacco. Do not use gum or patches that help you quit without your doctor's approval.  Schedule and go to doctor visits as told.  Nonpregnant women should have no more than 1 drink a day. Men should have no more than 2 drinks a day. Talk to your doctor about drinking alcohol.  Stop illegal drug use.  Stay current with shots (immunizations).  Manage your health  conditions as told by your doctor.  Learn to manage your stress.  Rest when you are tired.  If it is really hot outside: ? Avoid intense activities. ? Use air conditioning or fans, or get in a cooler place. ? Avoid caffeine and alcohol. ? Wear loose-fitting, lightweight, and light-colored clothing.  If it is really cold outside: ? Avoid intense activities. ? Layer your clothing. ? Wear mittens or gloves, a hat, and a scarf when going outside. ? Avoid alcohol.  Learn about heart failure and get support as needed.  Get help to maintain or improve your quality of life and your ability to care for yourself as needed. Contact a doctor if:  You gain weight quickly.  You are more short of breath than usual.  You cannot do your normal activities.  You tire easily.  You cough more than normal, especially with activity.  You have any or more puffiness (swelling) in areas such as your hands, feet, ankles, or belly (abdomen).  You cannot sleep because it is hard to breathe.  You feel like your heart is beating fast (palpitations).  You get dizzy or light-headed when you stand up. Get help right away if:  You have trouble breathing.  There is a change in mental status, such as becoming less alert or not being able to focus.  You have chest pain or discomfort.  You faint. This information is not intended to replace advice given to you by your health care provider. Make sure  you discuss any questions you have with your health care provider. Document Released: 11/24/2007 Document Revised: 07/23/2015 Document Reviewed: 04/02/2012 Elsevier Interactive Patient Education  2017 [employer].

## 2016-12-29 NOTE — Telephone Encounter (Signed)
Dr. Kirke CorinArida,   Pt [facility] an [facility]   Thanks   Stephanie Becvar Doles-Johnson, NP-C

## 2016-12-30 ENCOUNTER — Telehealth: Payer: Self-pay | Admitting: Cardiovascular Disease

## 2016-12-30 LAB — LIPID PANEL
CHOL/HDL RATIO: 3 ratio (ref 0.0–4.4)
Cholesterol, Total: 136 mg/dL (ref 100–199)
HDL: 45 mg/dL (ref >39)
LDL Calculated: 54 mg/dL (ref 0–99)
Triglycerides: 186 mg/dL — ABNORMAL HIGH (ref 0–149)
VLDL Cholesterol Cal: 37 mg/dL (ref 5–40)

## 2016-12-30 LAB — BASIC METABOLIC PANEL
BUN/Creatinine Ratio: 29 — ABNORMAL HIGH (ref 12–28)
BUN: 49 mg/dL — ABNORMAL HIGH (ref 8–27)
CHLORIDE: 95 mmol/L — AB (ref 96–106)
CO2: 27 mmol/L (ref 20–29)
Calcium: 9 mg/dL (ref 8.7–10.3)
Creatinine, Ser: 1.69 mg/dL — ABNORMAL HIGH (ref 0.57–1.00)
GFR, EST AFRICAN AMERICAN: 37 mL/min/{1.73_m2} — AB (ref >59)
GFR, EST NON AFRICAN AMERICAN: 32 mL/min/{1.73_m2} — AB (ref >59)
Glucose: 185 mg/dL — ABNORMAL HIGH (ref 65–99)
POTASSIUM: 4.7 mmol/L (ref 3.5–5.2)
SODIUM: 139 mmol/L (ref 134–144)

## 2016-12-30 NOTE — Telephone Encounter (Signed)
Financial plannerCalled Interpreter Services and s/w Interpreter [phone]. Pt unavailable at this time but s/w Cleotis LemaAbdullah ([facility]  Cleotis Lemabdullah speaks [place] and did not need interpreter but he stayed on the line until our conversation was complete in case interpreter was needed.  Cleotis Lemabdullah reports pt has few Entresto samples left and is agreeable to pick up more in our office today. Samples left at front desk. Notified Kristen, [facility].  Samples of this drug were given to the patient, quantity 2 boxes(28 day supply) , Lot Number [id number] Exp: 10/2018

## 2016-12-30 NOTE — Telephone Encounter (Signed)
Pharmacist wanted to let us know they are out of Entresto, and will be 4-6 weeks before they get any in. Please call.

## 2016-12-30 NOTE — Telephone Encounter (Signed)
I thought we took care of this

## 2016-12-30 NOTE — Telephone Encounter (Signed)
We did. Written prescriptions were left at the front desk on October 30 for Stephanie [name] at [facility]. [facility] was notified same day and written prescriptions were picked up on November 1.  Will follow up with Stephanie [name].

## 2017-01-02 ENCOUNTER — Ambulatory Visit: Payer: Self-pay | Attending: Family | Admitting: Family

## 2017-01-02 ENCOUNTER — Other Ambulatory Visit: Payer: Self-pay

## 2017-01-02 ENCOUNTER — Encounter: Payer: Self-pay | Admitting: Family

## 2017-01-02 VITALS — BP 98/61 | Resp 18 | Ht 59.0 in | Wt 123.1 lb

## 2017-01-02 DIAGNOSIS — E119 Type 2 diabetes mellitus without complications: Secondary | ICD-10-CM | POA: Insufficient documentation

## 2017-01-02 DIAGNOSIS — N183 Chronic kidney disease, stage 3 (moderate): Secondary | ICD-10-CM

## 2017-01-02 DIAGNOSIS — Z7902 Long term (current) use of antithrombotics/antiplatelets: Secondary | ICD-10-CM | POA: Insufficient documentation

## 2017-01-02 DIAGNOSIS — E079 Disorder of thyroid, unspecified: Secondary | ICD-10-CM | POA: Insufficient documentation

## 2017-01-02 DIAGNOSIS — Z79899 Other long term (current) drug therapy: Secondary | ICD-10-CM | POA: Insufficient documentation

## 2017-01-02 DIAGNOSIS — I252 Old myocardial infarction: Secondary | ICD-10-CM | POA: Insufficient documentation

## 2017-01-02 DIAGNOSIS — E785 Hyperlipidemia, unspecified: Secondary | ICD-10-CM | POA: Insufficient documentation

## 2017-01-02 DIAGNOSIS — M549 Dorsalgia, unspecified: Secondary | ICD-10-CM | POA: Insufficient documentation

## 2017-01-02 DIAGNOSIS — I110 Hypertensive heart disease with heart failure: Secondary | ICD-10-CM | POA: Insufficient documentation

## 2017-01-02 DIAGNOSIS — Z794 Long term (current) use of insulin: Secondary | ICD-10-CM | POA: Insufficient documentation

## 2017-01-02 DIAGNOSIS — E1122 Type 2 diabetes mellitus with diabetic chronic kidney disease: Secondary | ICD-10-CM

## 2017-01-02 DIAGNOSIS — Z7982 Long term (current) use of aspirin: Secondary | ICD-10-CM | POA: Insufficient documentation

## 2017-01-02 DIAGNOSIS — I5022 Chronic systolic (congestive) heart failure: Secondary | ICD-10-CM | POA: Insufficient documentation

## 2017-01-02 DIAGNOSIS — I10 Essential (primary) hypertension: Secondary | ICD-10-CM

## 2017-01-02 NOTE — Progress Notes (Signed)
Patient ID: Stephanie [name], female    DOB: 12-Jan-1951, 66 y.o.   MRN: [id number]  HPI  Ms Stephanie [name] is a 66 y/o female with a history of diabetes, hyperlipidemia, HTN, MI, thyroid disease and chronic heart failure.   Echo report from 12/04/17 reviewed and shows an EF of 20-25%.  Admitted 12/20/16 with HF exacerbation. Initially treated with IV furosemide and then switched to torsemide. Beta-blocker was changed to metoprolol succinate. Cardiology consult obtained with discussion about a possible LVAD in the future. Discharged home after 2 days. Was in the ED 12/14/16 due to hypotension and acute HF exacerbation. She was treated and released. Admitted 12/04/16 due to HF exacerbation. Cardiology consult was obtained and medications were adjusted and she was discharged home after 2 days. Was in the ED 11/28/16 due to heart failure symptoms. She was treated and released.   Patient presents for her initial visit with a chief complaint of mild fatigue upon moderate exertion. She describes this as chronic having been present for several months. She has associated decreased appetite and back pain. She denies any chest pain, shortness of breath, edema, palpitations, dizziness, difficulty sleeping or weight gain.   Past Medical History:  Diagnosis Date  . CHF (congestive heart failure) (HCC)   . Diabetes mellitus without complication (HCC)   . Hyperlipidemia   . Hypertension   . MI (myocardial infarction) (HCC)   . Thyroid disease    Past Surgical History:  Procedure Laterality Date  . CARDIAC SURGERY    . CATARACT EXTRACTION, BILATERAL    . CORONARY ANGIOPLASTY WITH STENT PLACEMENT    . HIP FRACTURE SURGERY     Family History  Problem Relation Age of Onset  . Hypertension Other    Social History   Tobacco Use  . Smoking status: Never Smoker  . Smokeless tobacco: Never Used  Substance Use Topics  . Alcohol use: No   No Known Allergies   Prior to Admission medications   Medication Sig Start  Date End Date Taking? Authorizing Provider  allopurinol (ZYLOPRIM) 100 MG tablet Take 1 tablet (100 mg total) by mouth daily. 12/29/16  Yes Doles-Johnson, Teah, NP  aspirin 81 MG chewable tablet Chew 81 mg by mouth daily.    Yes [provider]  clopidogrel (PLAVIX) 75 MG tablet Take 1 tablet (75 mg total) by mouth daily. 12/29/16  Yes Doles-Johnson, Teah, NP  Insulin Glargine (LANTUS SOLOSTAR) 100 UNIT/ML Solostar Pen Inject 28 Units into the skin at bedtime. 12/29/16  Yes Doles-Johnson, Teah, NP  levothyroxine (SYNTHROID) 25 MCG tablet Take 0.5 tablets (12.5 mcg total) by mouth daily before breakfast. 12/29/16  Yes Doles-Johnson, Teah, NP  metoprolol succinate (TOPROL-XL) 25 MG 24 hr tablet Take 0.5 tablets (12.5 mg total) by mouth daily. 12/29/16  Yes Doles-Johnson, Teah, NP  potassium chloride SA (K-DUR,KLOR-CON) 20 MEQ tablet Take 1 tablet (20 mEq total) by mouth daily. 12/29/16  Yes Doles-Johnson, Teah, NP  rosuvastatin (CRESTOR) 20 MG tablet Take 1 tablet (20 mg total) by mouth at bedtime. 12/29/16  Yes Doles-Johnson, Teah, NP  sacubitril-valsartan (ENTRESTO) 24-26 MG Take 1 tablet by mouth 2 (two) times daily. 12/27/16  Yes Iran Ouch, MD  spironolactone (ALDACTONE) 25 MG tablet Take 0.5 tablets (12.5 mg total) by mouth daily. 12/29/16  Yes Doles-Johnson, Teah, NP  torsemide (DEMADEX) 20 MG tablet Take 1 tablet (20 mg total) by mouth 2 (two) times daily. 12/29/16 02/27/17 Yes Doles-Johnson, Teah, NP  Insulin Glulisine (APIDRA SOLOSTAR) 100 UNIT/ML Solostar Pen  Inject 12-14 Units into the skin See admin instructions. 14 units in the morning before breakfast then 14 units before lunch then 12 units before evening meal    [provider]  UNABLE TO FIND Vismed (0.18% Sodium Hyaluronate): Instill 1 drop into both eyes three times a day    [provider]    Review of Systems  Constitutional: Positive for appetite change (decreased) and fatigue.  HENT: Negative for  congestion, postnasal drip and sore throat.   Eyes: Negative.   Respiratory: Negative for chest tightness and shortness of breath.   Cardiovascular: Negative for chest pain, palpitations and leg swelling.  Gastrointestinal: Negative for abdominal distention and abdominal pain.  Endocrine: Negative.   Genitourinary: Negative.   Musculoskeletal: Positive for back pain (after walking much). Negative for neck pain.  Skin: Negative.   Allergic/Immunologic: Negative.   Neurological: Negative for dizziness and light-headedness.  Hematological: Negative for adenopathy. Does not bruise/bleed easily.  Psychiatric/Behavioral: Negative for dysphoric mood and sleep disturbance (sleeping on 1 pillow). The patient is not nervous/anxious.    Vitals:   01/02/17 1152  BP: 98/61  Resp: 18  SpO2: 100%  Weight: 123 lb 2 oz (55.8 kg)  Height: 4\' 11"  (1.499 m)   Wt Readings from Last 3 Encounters:  01/02/17 123 lb 2 oz (55.8 kg)  12/29/16 125 lb 1.6 oz (56.7 kg)  12/22/16 124 lb 6.4 oz (56.4 kg)   Lab Results  Component Value Date   CREATININE 1.69 (H) 12/29/2016   CREATININE 1.66 (H) 12/22/2016   CREATININE 1.19 (H) 12/21/2016    Physical Exam  Constitutional: She is oriented to person, place, and time. She appears well-developed and well-nourished.  HENT:  Head: Normocephalic and atraumatic.  Neck: Normal range of motion. Neck supple. No JVD present.  Cardiovascular: Regular rhythm. Tachycardia present.  Pulmonary/Chest: Effort normal. She has no wheezes. She has no rales.  Abdominal: Soft. She exhibits no distension. There is no tenderness.  Musculoskeletal: She exhibits no edema or tenderness.  Neurological: She is alert and oriented to person, place, and time.  Skin: Skin is warm and dry.  Psychiatric: She has a normal mood and affect. Her behavior is normal. Thought content normal.  Nursing note and vitals reviewed.    Assessment & Plan:  1:Chronic heart failure with reduced  ejection fraction- - NYHA II - euvolemic today - already weighing daily and she was instructed to call for an overnight weight gain of >2 pounds or a weekly weight gain of >5 pounds - not adding salt to her food. Can use Mrs. Dash for seasoning. Daughter is already reading food labels and the importance of closely following a 2000mg  sodium diet was discussed with patient and family - limited in medications due to her low BP - discussed the [facility] in [place] and a brochure was provided to them. They will discuss with cardiology at her appointment on 01/06/17  2: HTN- - BP on the low side but she is currently without dizziness - BMP from 12/29/16 reviewed and shows sodium 139, potassium 4.7 and GFR 32  3: Diabetes- - A1c on 12/04/16 was 7.7% - follows with [facility] [place]  Medication bottles were reviewed.   Return in 1 month or sooner for any questions/problems before then.  Entire visit was done with the arabic interpreter present.

## 2017-01-02 NOTE — Patient Instructions (Signed)
Continue weighing daily and call for an overnight weight gain of > 2 pounds or a weekly weight gain of >5 pounds. 

## 2017-01-03 ENCOUNTER — Ambulatory Visit: Payer: Self-pay | Admitting: Pharmacy Technician

## 2017-01-03 DIAGNOSIS — I10 Essential (primary) hypertension: Secondary | ICD-10-CM | POA: Insufficient documentation

## 2017-01-03 NOTE — Progress Notes (Addendum)
  Patient was a no-show for eligibility appointment.  Arabic interpreter contacted patient.  Patient stated that she was unaware that she needed to come in for eligibility appt.  Pt had already completed [facility]'s application and provided poi.  Still need patient to complete [facility]'s contract.  [facility]'s contract was verbally communicated with patient using Arabic interpreter.  Patient told Arabic interperter that she had no questions regarding [facility]'s contract .  [facility]'s contract mailed to patient for signature.   Patient is not a [place] Citizen or Legal Resident.  This will prevent [facility] from being able to obtain brand-name medications from Pharmaceutical Companies that require citizenship or legal residency.  Entresto and Synthroid Prescription Applications completed.  [facility] and [facility].  Upon receipt of signed applications from provider and  from patient, [facility] Pres[facility] Application will be submitted to [employer] and Synthroid Prescription Application will be submitted to [employer].  Referred patient for MTM.  Sherilyn DacostaBetty J. Kimberlyn Quiocho Care Manager [facility]

## 2017-01-05 ENCOUNTER — Telehealth: Payer: Self-pay

## 2017-01-05 NOTE — Telephone Encounter (Signed)
[facility]

## 2017-01-06 ENCOUNTER — Ambulatory Visit (INDEPENDENT_AMBULATORY_CARE_PROVIDER_SITE_OTHER): Payer: Self-pay | Admitting: Nurse Practitioner

## 2017-01-06 ENCOUNTER — Encounter: Payer: Self-pay | Admitting: Nurse Practitioner

## 2017-01-06 ENCOUNTER — Other Ambulatory Visit: Payer: Self-pay

## 2017-01-06 VITALS — BP 82/50 | HR 80 | Ht 59.0 in | Wt 123.8 lb

## 2017-01-06 DIAGNOSIS — I952 Hypotension due to drugs: Secondary | ICD-10-CM

## 2017-01-06 DIAGNOSIS — E785 Hyperlipidemia, unspecified: Secondary | ICD-10-CM

## 2017-01-06 DIAGNOSIS — I5022 Chronic systolic (congestive) heart failure: Secondary | ICD-10-CM

## 2017-01-06 DIAGNOSIS — I255 Ischemic cardiomyopathy: Secondary | ICD-10-CM

## 2017-01-06 DIAGNOSIS — I2510 Atherosclerotic heart disease of native coronary artery without angina pectoris: Secondary | ICD-10-CM

## 2017-01-06 MED ORDER — SACUBITRIL-VALSARTAN 24-26 MG PO TABS: 1.0000 | ORAL_TABLET | Freq: Every day | ORAL | 3 refills | Status: DC

## 2017-01-06 NOTE — Progress Notes (Signed)
Office Visit    Patient Name: Stephanie [name] Date of Encounter: 01/06/2017  Primary Care Provider:  Patient, No Pcp Per Primary Cardiologist:  M. Kirke CorinArida, MD   Chief Complaint    66 y/o ? with a h/o CAD s/p prior LAD/RCA stenting, ICM/HFrEF with an EF 25%, HTN, HL, DM, and hypothyroidism, who presents for f/u after recent hospitalization.  Past Medical History    Past Medical History:  Diagnosis Date  . CAD (coronary artery disease)    a. 05/2016 Late presenting MI s/p PCI to LAD and RCA; b. 10/2016 Cath ([place]) patent stents in LAD/RCA, EF 25%.  . Chronic systolic CHF (congestive heart failure) (HCC)    a. 11/2016 Echo: EF 20-25%, mid antsept AK, mid ant/antlat HK.  . Diabetes mellitus without complication (HCC)   . Hyperlipidemia   . Hypertension   . Ischemic cardiomyopathy    a. 11/2016 Echo: EF 20-25%.  . MI (myocardial infarction) (HCC)   . Thyroid disease    Past Surgical History:  Procedure Laterality Date  . CARDIAC SURGERY    . CATARACT EXTRACTION, BILATERAL    . CORONARY ANGIOPLASTY WITH STENT PLACEMENT    . HIP FRACTURE SURGERY      Allergies  No Known Allergies  History of Present Illness    66 y/o ? with the above complex PMH including CAD, HTN, HL, DM, ICM, HFrEF, and hypothyroidism.  She is s/p late-presenting MI earlier this year with PCI of the LAD and RCA.  EF 25%.  More recently, cath in Sept in [place], revealing patent stents.  She has had several admission related to CHF. EF 20-25% by echo in early October.  Med Rx somewhat limited by hypotension.  She was recently admitted and subsequently d/c'd after presentation with recurrent CHF and hypotension.  Meds adjusted, corlanor d/c'd due to concerns that low HRs (60's) may have been contributing to low output.  She was d/c'd home @ 124 lbs on 10/25.  Creat did rise to 1.66 with diuresis.  This was f/u on 11/1 and was sl higher @ 1.69.  She was seen in CHF clinic on 11/6 and was stable.  She presents today  with two family members and an interpreter.  She has been doing well w/o chest pain or dyspnea.  She denies palpitations, pnd, orthopnea, n, v, dizziness, syncope, edema, weight gain, or early satiety.  She has had some orthostatic lightheadedness.  BP is 80 today.  She has been compliant with all meds.  Home Medications    Prior to Admission medications   Medication Sig Start Date End Date Taking? Authorizing Provider  allopurinol (ZYLOPRIM) 100 MG tablet Take 1 tablet (100 mg total) by mouth daily. 12/29/16  Yes Doles-Johnson, Teah, NP  aspirin 81 MG chewable tablet Chew 81 mg by mouth daily.    Yes [provider]  clopidogrel (PLAVIX) 75 MG tablet Take 1 tablet (75 mg total) by mouth daily. 12/29/16  Yes Doles-Johnson, Teah, NP  Insulin Glargine (LANTUS SOLOSTAR) 100 UNIT/ML Solostar Pen Inject 28 Units into the skin at bedtime. 12/29/16  Yes Doles-Johnson, Teah, NP  Insulin Glulisine (APIDRA SOLOSTAR) 100 UNIT/ML Solostar Pen Inject 12-14 Units into the skin See admin instructions. 14 units in the morning before breakfast then 14 units before lunch then 12 units before evening meal   Yes [provider]  levothyroxine (SYNTHROID) 25 MCG tablet Take 0.5 tablets (12.5 mcg total) by mouth daily before breakfast. 12/29/16  Yes Doles-Johnson, Teah, NP  metoprolol succinate (TOPROL-XL) 25 MG 24 hr tablet Take 0.5 tablets (12.5 mg total) by mouth daily. 12/29/16  Yes Doles-Johnson, Teah, NP  potassium chloride SA (K-DUR,KLOR-CON) 20 MEQ tablet Take 1 tablet (20 mEq total) by mouth daily. 12/29/16  Yes Doles-Johnson, Teah, NP  rosuvastatin (CRESTOR) 20 MG tablet Take 1 tablet (20 mg total) by mouth at bedtime. 12/29/16  Yes Doles-Johnson, Teah, NP  sacubitril-valsartan (ENTRESTO) 24-26 MG Take 1 tablet by mouth 2 (two) times daily. 12/27/16  Yes Iran OuchArida, Muhammad A, MD  spironolactone (ALDACTONE) 25 MG tablet Take 0.5 tablets (12.5 mg total) by mouth daily. 12/29/16  Yes Doles-Johnson, Teah, NP    torsemide (DEMADEX) 20 MG tablet Take 1 tablet (20 mg total) by mouth 2 (two) times daily. 12/29/16 02/27/17 Yes Doles-Johnson, Teah, NP  UNABLE TO FIND Vismed (0.18% Sodium Hyaluronate): Instill 1 drop into both eyes three times a day   Yes [provider]    Review of Systems    She denies chest pain, palpitations, dyspnea, pnd, orthopnea, n, v, dizziness, syncope, edema, weight gain, or early satiety.   She has had some orthostasis/lightheadedness.  All other systems reviewed and are otherwise negative except as noted above.  Physical Exam    VS:  BP (!) 82/50 (BP Location: Left Arm, Patient Position: Sitting, Cuff Size: Normal)   Pulse 80   Ht 4\' 11"  (1.499 m)   Wt 123 lb 12 oz (56.1 kg)   BMI 24.99 kg/m  , BMI Body mass index is 24.99 kg/m. GEN: Well nourished, well developed, in no acute distress.  HEENT: normal.  Neck: Supple, no JVD, carotid bruits, or masses. Cardiac: RRR, no murmurs, rubs, or gallops. No clubbing, cyanosis, edema.  Radials/DP/PT 2+ and equal bilaterally.  Respiratory:  Respirations regular and unlabored, clear to auscultation bilaterally. GI: Soft, nontender, nondistended, BS + x 4. MS: no deformity or atrophy. Skin: warm and dry, no rash. Neuro:  Strength and sensation are intact. Psych: Normal affect.  Accessory Clinical Findings    ECG - RSR, 80, LAD, LAFB, IVCD, septal infarct.  Lab Results  Component Value Date   CREATININE 1.69 (H) 12/29/2016   BUN 49 (H) 12/29/2016   NA 139 12/29/2016   K 4.7 12/29/2016   CL 95 (L) 12/29/2016   CO2 27 12/29/2016     Assessment & Plan    1.  ICM/Chronic Systolic CHF:  EF 20-25% by echo in early October.  Recent hospitalization for recurrent CHF and hypotension.  Corlanor d/c'd during admission and coreg changed to Toprol XL. Cleda DaubSpiro added @ 12.5 mg daily.  Remains on low dose entresto and reports compliance.  Volume stable on exam.  She remains on torsemide 20 BID.  Creat bumped some prior to  discharge and some further on 11/1.  I'm concerned that she might be prerenal, driving hypotension.  I will check a BMET today and will have a low threshold to reduce torsemide if necessary.  I've asked her to reduce her entresto 24-26 to 1 tab daily in light of hypotension.  We may need to stop it altogether and switch to low dose losartan or acei (less expensive) if pressure remains this soft.  She has f/u wit Dr. Kirke CorinArida @ the [facility] on 11/17 and we can re-eval BP @ that time.  I have also [facility]  She had previously missed her appt there.  It had also been previously recommended to her and her  family that they consider referral to [facility] in [place].  I answered questions re: the services that we off there.  They'd like to keep care local if at all possible and provided that she remains stable and does not require advanced therapies (home milrinone, etc), that would seem to be feasible.  2.  CAD:  S/p prior LAD/RCA stenting in [place] with reportedly patent stents in 10/2016.  No chest pain.  Cont asa,  blocker, plavix, statin.  She has an interest in cardiac rehab but does not have insurance.  3.  Hypotension:  With associated intermittent orthostatic lightheadedness.  BP 82 today.  Reducing entresto to once daily as above.  Suspect we may need to adjust torsemide as well.  BMET pending.  4.  DM II:  [facility]  5.  HL:  On statin.  LDL 54 11/1.  6.  Disposition:  F/u BMET today.  F/u w/ Dr. Kirke Corin @ [facility] on 11/17.  F/u with CHF clinic in Dec.   Nicolasa Ducking, NP 01/06/2017, 2:37 PM

## 2017-01-06 NOTE — Patient Instructions (Addendum)
Medication Instructions:  Your physician has recommended you make the following change in your medication:  DECREASE entresto 24-24mg  once a day   Labwork: BMET today  Testing/Procedures: none  Follow-Up: [facility] [place] [place] [facility].    Any Other Special Instructions Will Be Listed Below (If Applicable).     If you need a refill on your cardiac medications before your next appointment, please call your pharmacy.

## 2017-01-07 LAB — BASIC METABOLIC PANEL
BUN / CREAT RATIO: 43 — AB (ref 12–28)
BUN: 59 mg/dL — ABNORMAL HIGH (ref 8–27)
CHLORIDE: 98 mmol/L (ref 96–106)
CO2: 28 mmol/L (ref 20–29)
Calcium: 9.3 mg/dL (ref 8.7–10.3)
Creatinine, Ser: 1.36 mg/dL — ABNORMAL HIGH (ref 0.57–1.00)
GFR calc non Af Amer: 41 mL/min/{1.73_m2} — ABNORMAL LOW (ref >59)
GFR, EST AFRICAN AMERICAN: 48 mL/min/{1.73_m2} — AB (ref >59)
GLUCOSE: 101 mg/dL — AB (ref 65–99)
Potassium: 5.3 mmol/L — ABNORMAL HIGH (ref 3.5–5.2)
SODIUM: 140 mmol/L (ref 134–144)

## 2017-01-09 ENCOUNTER — Telehealth: Payer: Self-pay | Admitting: Pharmacy Technician

## 2017-01-09 NOTE — Telephone Encounter (Signed)
Patient returned signed contract.  Eligible to receive medication assistance through 2018, as long as eligibility criteria continues to be met.  [facility]

## 2017-01-18 ENCOUNTER — Telehealth: Payer: Self-pay

## 2017-01-18 NOTE — Telephone Encounter (Signed)
Placed signed application/script in [facility] folder for pickup. 

## 2017-01-18 NOTE — Telephone Encounter (Signed)
Received [facility] from [facility] ent[facility]o and synthroid placed for provider to sign.

## 2017-01-24 ENCOUNTER — Other Ambulatory Visit: Payer: Self-pay | Admitting: Adult Health Nurse Practitioner

## 2017-01-24 MED ORDER — INSULIN LISPRO 100 UNIT/ML ~~LOC~~ SOLN: SUBCUTANEOUS | 11 refills | Status: DC

## 2017-01-24 NOTE — Telephone Encounter (Signed)
This encounter was created in error - please disregard.

## 2017-01-25 ENCOUNTER — Telehealth: Payer: Self-pay | Admitting: Cardiovascular Disease

## 2017-01-25 ENCOUNTER — Telehealth: Payer: Self-pay

## 2017-01-25 ENCOUNTER — Ambulatory Visit: Payer: Self-pay | Admitting: Internal Medicine

## 2017-01-25 NOTE — Telephone Encounter (Signed)
S/w Lorrie. Pt awaiting Entresto to arrive from patient assistance but pt currently out. She will notify pt to pick up samples in our office.  Samples of Entresto 24-26mg  were given to the patient, quantity 3 bottles, Lot Number [id number] Exp 9/20

## 2017-01-25 NOTE — Telephone Encounter (Signed)
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  We are able to use the [facility] insulin program, but are limited to Humalog, Humulin R and N and Humulin 70/30. All would be in vials.  We would need a new Rx for either Humalog and Humulin N or if Teah feels comfortable trying to convert her to Humulin 70/30,  [employer] (Lantus/Toujeo and Apidra) [employer] (Levemir/Tresiba or Novolog) Lilly (Humalog or Hospital doctorBasaglar (similar to Lantus)  [facility]

## 2017-01-25 NOTE — Telephone Encounter (Signed)
Lorrie from [facility] calling to see if patient can receive samples of Entresto while she waits for medication to arrive Please give Lorrie a call at [phone]

## 2017-01-26 ENCOUNTER — Telehealth: Payer: Self-pay | Admitting: Pharmacist

## 2017-01-26 NOTE — Telephone Encounter (Signed)
01/26/17 Faxed [employer] application for [employer] Corporation[employer]resto 24/26 Take 1 tablet by mouth twice a day.Stephanie RadonAJ

## 2017-01-26 NOTE — Telephone Encounter (Signed)
01/26/17 Faxed [employer] application for Synthroid 25mcg Take 1 tablet by mouth daily before breakfast.AJ

## 2017-01-27 ENCOUNTER — Telehealth: Payer: Self-pay | Admitting: Pharmacist

## 2017-01-27 NOTE — Telephone Encounter (Signed)
01/27/17 Felton SinkRita brought me back a copy of the script I gave her where I ordered Synthroid 25mcg, she stated patient has already gotten Levothyroxine, therefore they [facility]."Due to therapeutic index changes, patient should not got back & forth from Synthroid to Levothyroxine or vise versa. We have already given patient Levothyroxine so will continue to fill in house."  Will not need to re order Synthroid for patient.Forde RadonAJ

## 2017-02-02 ENCOUNTER — Encounter: Payer: Self-pay | Admitting: Adult Health Nurse Practitioner

## 2017-02-02 ENCOUNTER — Ambulatory Visit: Payer: Self-pay | Admitting: Adult Health Nurse Practitioner

## 2017-02-02 VITALS — BP 85/61 | HR 89 | Temp 98.2°F | Wt 127.0 lb

## 2017-02-02 DIAGNOSIS — E1122 Type 2 diabetes mellitus with diabetic chronic kidney disease: Secondary | ICD-10-CM

## 2017-02-02 DIAGNOSIS — N183 Chronic kidney disease, stage 3 (moderate): Principal | ICD-10-CM

## 2017-02-02 DIAGNOSIS — Z794 Long term (current) use of insulin: Principal | ICD-10-CM

## 2017-02-02 MED ORDER — INSULIN NPH ISOPHANE & REGULAR (70-30) 100 UNIT/ML ~~LOC~~ SUSP: 10.0000 [IU] | Freq: Two times a day (BID) | SUBCUTANEOUS | 11 refills | Status: AC

## 2017-02-02 NOTE — Progress Notes (Signed)
Subjective:    Patient ID: Stephanie MilroyFaeda [name], female    DOB: 1950-05-24, 66 y.o.   MRN: [id number]  HPI   Stephanie [name] is a 66 yo female here for insulin changes. Last A1c in Oct. was 7.7. She was on Lantus.   Patient Active Problem List   Diagnosis Date Noted  . HTN (hypertension) 01/03/2017  . CHF (congestive heart failure) (HCC) 12/29/2016  . Hyperlipidemia 12/29/2016  . Hypothyroidism 12/21/2016  . CAD (coronary artery disease) 12/21/2016  . Diabetes (HCC) 12/21/2016   Allergies as of 02/02/2017   No Known Allergies     Medication List        Accurate as of 02/02/17  7:16 PM. Always use your most recent med list.          allopurinol 100 MG tablet Commonly known as:  ZYLOPRIM Take 1 tablet (100 mg total) by mouth daily.   aspirin 81 MG chewable tablet Chew 81 mg by mouth daily.   clopidogrel 75 MG tablet Commonly known as:  PLAVIX Take 1 tablet (75 mg total) by mouth daily.   Insulin Glargine 100 UNIT/ML Solostar Pen Commonly known as:  LANTUS SOLOSTAR Inject 28 Units into the skin at bedtime.   insulin lispro 100 UNIT/ML injection Commonly known as:  HUMALOG 14 units before breakfast and lunch then 12 units with dinner   levothyroxine 25 MCG tablet Commonly known as:  SYNTHROID Take 0.5 tablets (12.5 mcg total) by mouth daily before breakfast.   metoprolol succinate 25 MG 24 hr tablet Commonly known as:  TOPROL-XL Take 0.5 tablets (12.5 mg total) by mouth daily.   potassium chloride SA 20 MEQ tablet Commonly known as:  K-DUR,KLOR-CON Take 1 tablet (20 mEq total) by mouth daily.   rosuvastatin 20 MG tablet Commonly known as:  CRESTOR Take 1 tablet (20 mg total) by mouth at bedtime.   sacubitril-valsartan 24-26 MG Commonly known as:  ENTRESTO Take 1 tablet daily by mouth.   spironolactone 25 MG tablet Commonly known as:  ALDACTONE Take 0.5 tablets (12.5 mg total) by mouth daily.   torsemide 20 MG tablet Commonly known as:  DEMADEX Take 1 tablet  (20 mg total) by mouth 2 (two) times daily.   UNABLE TO FIND Vismed (0.18% Sodium Hyaluronate): Instill 1 drop into both eyes three times a day        Review of Systems  Pt's daughter reports her mother tried other insulin and it didn't work but agrees to try something different. Pt is taking 28 units of Lantus at bedtime and Lispro 14 units in morning and at lunch and 12 units at dinner. Self-reported blood sugars run 130-270. CHF - [facility] not [facility] to the heart failure clinic. Pt is getting samples of Entresto from cardio Dr but pt is going to ask for Rx at next visit on 12/15.    Objective:   Physical Exam  Constitutional: She is oriented to person, place, and time. She appears well-developed and well-nourished.  Cardiovascular: Normal rate, regular rhythm and normal heart sounds.  Pulmonary/Chest: Effort normal and breath sounds normal.  Abdominal: Soft. Bowel sounds are normal.  Neurological: She is alert and oriented to person, place, and time.  Vitals reviewed.   BP (!) 85/61 (BP Location: Left Arm, Patient Position: Sitting, Cuff Size: Normal)   Pulse 89   Temp 98.2 F (36.8 C) (Oral)   Wt 127 lb (57.6 kg)   BMI 25.65 kg/m  Assessment & Plan:   Change Lantus to Humulin 70-30, 10 units BID. F/u in 6 weeks to monitor response to Humulin. Check A1c at next visit. Told to call if CBG consistently >250.  Pt needs glucose strips, lancets, and insulin needles- given.   Followed by cardiology- encouraged to FU at the HF clinic. States she is on [place].   Interpreter used.

## 2017-02-15 ENCOUNTER — Ambulatory Visit: Payer: Self-pay | Admitting: Family

## 2017-02-15 ENCOUNTER — Telehealth: Payer: Self-pay | Admitting: Family

## 2017-02-15 NOTE — Telephone Encounter (Signed)
Patient did not show for her Heart Failure Clinic appointment on 02/15/17. Will attempt to reschedule.  

## 2017-02-15 NOTE — Progress Notes (Deleted)
Patient ID: Stephanie [name], female    DOB: 01-21-1951, 66 y.o.   MRN: [id number]  HPI  Ms Jamison NeighborBeliah is a 66 y/o female with a history of diabetes, hyperlipidemia, HTN, MI, thyroid disease and chronic heart failure.   Echo report from 12/04/17 reviewed and shows an EF of 20-25%.  Admitted 12/20/16 with HF exacerbation. Initially treated with IV furosemide and then switched to torsemide. Beta-blocker was changed to metoprolol succinate. Cardiology consult obtained with discussion about a possible LVAD in the future. Discharged home after 2 days. Was in the ED 12/14/16 due to hypotension and acute HF exacerbation. She was treated and released. Admitted 12/04/16 due to HF exacerbation. Cardiology consult was obtained and medications were adjusted and she was discharged home after 2 days. Was in the ED 11/28/16 due to heart failure symptoms. She was treated and released.   Patient presents for her initial visit with a chief complaint of mild fatigue upon moderate exertion. She describes this as chronic having been present for several months. She has associated decreased appetite and back pain. She denies any chest pain, shortness of breath, edema, palpitations, dizziness, difficulty sleeping or weight gain.   Past Medical History:  Diagnosis Date  . CAD (coronary artery disease)    a. 05/2016 Late presenting MI s/p PCI to LAD and RCA; b. 10/2016 Cath ([place]) patent stents in LAD/RCA, EF 25%.  . Chronic systolic CHF (congestive heart failure) (HCC)    a. 11/2016 Echo: EF 20-25%, mid antsept AK, mid ant/antlat HK.  . Diabetes mellitus without complication (HCC)   . Hyperlipidemia   . Hypertension   . Ischemic cardiomyopathy    a. 11/2016 Echo: EF 20-25%.  . MI (myocardial infarction) (HCC)   . Thyroid disease    Past Surgical History:  Procedure Laterality Date  . CARDIAC SURGERY    . CATARACT EXTRACTION, BILATERAL    . CORONARY ANGIOPLASTY WITH STENT PLACEMENT    . HIP FRACTURE SURGERY     Family  History  Problem Relation Age of Onset  . Hypertension Other    Social History   Tobacco Use  . Smoking status: Never Smoker  . Smokeless tobacco: Never Used  Substance Use Topics  . Alcohol use: No   No Known Allergies     Review of Systems  Constitutional: Positive for appetite change (decreased) and fatigue.  HENT: Negative for congestion, postnasal drip and sore throat.   Eyes: Negative.   Respiratory: Negative for chest tightness and shortness of breath.   Cardiovascular: Negative for chest pain, palpitations and leg swelling.  Gastrointestinal: Negative for abdominal distention and abdominal pain.  Endocrine: Negative.   Genitourinary: Negative.   Musculoskeletal: Positive for back pain (after walking much). Negative for neck pain.  Skin: Negative.   Allergic/Immunologic: Negative.   Neurological: Negative for dizziness and light-headedness.  Hematological: Negative for adenopathy. Does not bruise/bleed easily.  Psychiatric/Behavioral: Negative for dysphoric mood and sleep disturbance (sleeping on 1 pillow). The patient is not nervous/anxious.     Lab Results  Component Value Date   CREATININE 1.36 (H) 01/06/2017   CREATININE 1.69 (H) 12/29/2016   CREATININE 1.66 (H) 12/22/2016    Physical Exam  Constitutional: She is oriented to person, place, and time. She appears well-developed and well-nourished.  HENT:  Head: Normocephalic and atraumatic.  Neck: Normal range of motion. Neck supple. No JVD present.  Cardiovascular: Regular rhythm. Tachycardia present.  Pulmonary/Chest: Effort normal. She has no wheezes. She has no rales.  Abdominal: Soft.  She exhibits no distension. There is no tenderness.  Musculoskeletal: She exhibits no edema or tenderness.  Neurological: She is alert and oriented to person, place, and time.  Skin: Skin is warm and dry.  Psychiatric: She has a normal mood and affect. Her behavior is normal. Thought content normal.  Nursing note and  vitals reviewed.    Assessment & Plan:  1:Chronic heart failure with reduced ejection fraction- - NYHA II - euvolemic today - already weighing daily and she was instructed to call for an overnight weight gain of >2 pounds or a weekly weight gain of >5 pounds - not adding salt to her food. Can use Mrs. Dash for seasoning. Daughter is already reading food labels and the importance of closely following a 2000mg  sodium diet was discussed with patient and family - limited in medications due to her low BP - discussed [facility] in [place] and a brochure was provided to them. They will discuss with cardiology at her appointment on 01/06/17  2: HTN- - BP on the low side but she is currently without dizziness - BMP from 12/29/16 reviewed and shows sodium 139, potassium 4.7 and GFR 32  3: Diabetes- - A1c on 12/04/16 was 7.7% - follows with [facility] [place]  Medication bottles were reviewed.   Return in 1 month or sooner for any questions/problems before then.  Entire visit was done with the arabic interpreter present.

## 2017-03-02 ENCOUNTER — Ambulatory Visit: Payer: Self-pay | Admitting: Adult Health Nurse Practitioner

## 2017-03-02 VITALS — BP 87/56 | HR 89 | Temp 97.8°F | Wt 130.2 lb

## 2017-03-02 DIAGNOSIS — Z794 Long term (current) use of insulin: Principal | ICD-10-CM

## 2017-03-02 DIAGNOSIS — N183 Chronic kidney disease, stage 3 (moderate): Principal | ICD-10-CM

## 2017-03-02 DIAGNOSIS — E1122 Type 2 diabetes mellitus with diabetic chronic kidney disease: Secondary | ICD-10-CM

## 2017-03-02 NOTE — Progress Notes (Signed)
Subjective:    Patient ID: Stephanie [name], female    DOB: 11-05-1950, 67 y.o.   MRN: [id number]  HPI   Stephanie [name] is a 67 yo female here for f/u of diabetes. At last visit, I changed her Lantus to Humulin 70-30, 10 units BID.    Pt is concerned for her kidneys.   Patient Active Problem List   Diagnosis Date Noted  . HTN (hypertension) 01/03/2017  . CHF (congestive heart failure) (HCC) 12/29/2016  . Hyperlipidemia 12/29/2016  . Hypothyroidism 12/21/2016  . CAD (coronary artery disease) 12/21/2016  . Diabetes (HCC) 12/21/2016   Allergies as of 03/02/2017   No Known Allergies     Medication List        Accurate as of 03/02/17  7:19 PM. Always use your most recent med list.          allopurinol 100 MG tablet Commonly known as:  ZYLOPRIM Take 1 tablet (100 mg total) by mouth daily.   aspirin 81 MG chewable tablet Chew 81 mg by mouth daily.   clopidogrel 75 MG tablet Commonly known as:  PLAVIX Take 1 tablet (75 mg total) by mouth daily.   insulin NPH-regular Human (70-30) 100 UNIT/ML injection Commonly known as:  NOVOLIN 70/30 Inject 10 Units into the skin 2 (two) times daily with a meal.   levothyroxine 25 MCG tablet Commonly known as:  SYNTHROID Take 0.5 tablets (12.5 mcg total) by mouth daily before breakfast.   metoprolol succinate 25 MG 24 hr tablet Commonly known as:  TOPROL-XL Take 0.5 tablets (12.5 mg total) by mouth daily.   potassium chloride SA 20 MEQ tablet Commonly known as:  K-DUR,KLOR-CON Take 1 tablet (20 mEq total) by mouth daily.   rosuvastatin 20 MG tablet Commonly known as:  CRESTOR Take 1 tablet (20 mg total) by mouth at bedtime.   sacubitril-valsartan 24-26 MG Commonly known as:  ENTRESTO Take 1 tablet daily by mouth.   spironolactone 25 MG tablet Commonly known as:  ALDACTONE Take 0.5 tablets (12.5 mg total) by mouth daily.   torsemide 20 MG tablet Commonly known as:  DEMADEX Take 1 tablet (20 mg total) by mouth 2 (two) times  daily.   UNABLE TO FIND Vismed (0.18% Sodium Hyaluronate): Instill 1 drop into both eyes three times a day        Review of Systems  Pt's family member reports Stephanie [name] did not continue Humulin b/c it elevated her blood sugar to 400. She switched back to her Apidra - shipped from [place]. Pt reports once her old insulin is gone in a month, she will switch back to Humulin. Pt is taking 14, 14, and 12 before bed.  Pt missed her last heart failure clinic apt. Pt was not aware of apt.  Pt reports her urination is normal. Her last renal labs were normal.     Objective:   Physical Exam  Constitutional: She is oriented to person, place, and time. She appears well-developed and well-nourished.  Cardiovascular: Normal rate, regular rhythm and normal heart sounds.  Pulmonary/Chest: Effort normal and breath sounds normal.  Abdominal: Soft. Bowel sounds are normal.  Neurological: She is alert and oriented to person, place, and time.  Skin: Skin is warm and dry.    BP (!) 87/56 (BP Location: Right Arm, Patient Position: Sitting, Cuff Size: Normal)   Pulse 89   Temp 97.8 F (36.6 C) (Oral)   Wt 130 lb 3.2 oz (59.1 kg)   BMI 26.30 kg/m  Assessment & Plan:   F/u in 3 weeks for insulin changes.  Plan to change apridra back to novolin as patient states she may not be able to get more insulin form her country.   Keep apt for hear failure clinic on Jan. 8.  Interpreter used.

## 2017-03-03 LAB — HEMOGLOBIN A1C
Est. average glucose Bld gHb Est-mCnc: 183 mg/dL
HEMOGLOBIN A1C: 8 % — AB (ref 4.8–5.6)

## 2017-03-06 NOTE — Progress Notes (Signed)
Patient ID: Stephanie [name], female    DOB: 1950/05/05, 67 y.o.   MRN: [id number]  HPI  Stephanie [name] is a 67 y/o female with a history of diabetes, hyperlipidemia, HTN, MI, thyroid disease and chronic heart failure.   Echo report from 12/04/17 reviewed and shows an EF of 20-25%.  Admitted 12/20/16 with HF exacerbation. Initially treated with IV furosemide and then switched to torsemide. Beta-blocker was changed to metoprolol succinate. Cardiology consult obtained with discussion about a possible LVAD in the future. Discharged home after 2 days. Was in the ED 12/14/16 due to hypotension and acute HF exacerbation. She was treated and released. Admitted 12/04/16 due to HF exacerbation. Cardiology consult was obtained and medications were adjusted and she was discharged home after 2 days. Was in the ED 11/28/16 due to heart failure symptoms. She was treated and released.   Patient presents for her follow-up visit with a chief complaint of minimal fatigue upon moderate exertion. She says that she's getting more energy as time goes on. She denies any chest pain, cough, shortness of breath, dizziness, edema, palpitations or difficulty sleeping.   Past Medical History:  Diagnosis Date  . CAD (coronary artery disease)    a. 05/2016 Late presenting MI s/p PCI to LAD and RCA; b. 10/2016 Cath ([place]) patent stents in LAD/RCA, EF 25%.  . Chronic systolic CHF (congestive heart failure) (HCC)    a. 11/2016 Echo: EF 20-25%, mid antsept AK, mid ant/antlat HK.  . Diabetes mellitus without complication (HCC)   . Hyperlipidemia   . Hypertension   . Ischemic cardiomyopathy    a. 11/2016 Echo: EF 20-25%.  . MI (myocardial infarction) (HCC)   . Thyroid disease    Past Surgical History:  Procedure Laterality Date  . CARDIAC SURGERY    . CATARACT EXTRACTION, BILATERAL    . CORONARY ANGIOPLASTY WITH STENT PLACEMENT    . HIP FRACTURE SURGERY     Family History  Problem Relation Age of Onset  . Hypertension Other     Social History   Tobacco Use  . Smoking status: Never Smoker  . Smokeless tobacco: Never Used  Substance Use Topics  . Alcohol use: No   No Known Allergies   Prior to Admission medications   Medication Sig Start Date End Date Taking? Authorizing Provider  allopurinol (ZYLOPRIM) 100 MG tablet Take 1 tablet (100 mg total) by mouth daily. 12/29/16  Yes Doles-Johnson, Teah, NP  aspirin 81 MG chewable tablet Chew 81 mg by mouth daily.    Yes [provider]  clopidogrel (PLAVIX) 75 MG tablet Take 1 tablet (75 mg total) by mouth daily. 12/29/16  Yes Doles-Johnson, Teah, NP  insulin NPH-regular Human (NOVOLIN 70/30) (70-30) 100 UNIT/ML injection Inject 10 Units into the skin 2 (two) times daily with a meal. 02/02/17  Yes Doles-Johnson, Teah, NP  levothyroxine (SYNTHROID) 25 MCG tablet Take 0.5 tablets (12.5 mcg total) by mouth daily before breakfast. 12/29/16  Yes Doles-Johnson, Teah, NP  metoprolol succinate (TOPROL-XL) 25 MG 24 hr tablet Take 0.5 tablets (12.5 mg total) by mouth daily. Patient taking differently: Take 25 mg by mouth daily.  12/29/16  Yes Doles-Johnson, Teah, NP  potassium chloride SA (K-DUR,KLOR-CON) 20 MEQ tablet Take 1 tablet (20 mEq total) by mouth daily. 12/29/16  Yes Doles-Johnson, Teah, NP  rosuvastatin (CRESTOR) 20 MG tablet Take 1 tablet (20 mg total) by mouth at bedtime. 12/29/16  Yes Doles-Johnson, Teah, NP  sacubitril-valsartan (ENTRESTO) 24-26 MG Take 1 tablet daily by mouth.  01/06/17  Yes Creig HinesBerge, Christopher Ronald, NP  spironolactone (ALDACTONE) 25 MG tablet Take 0.5 tablets (12.5 mg total) by mouth daily. 12/29/16  Yes Doles-Johnson, Teah, NP  torsemide (DEMADEX) 20 MG tablet Take 1 tablet (20 mg total) by mouth 2 (two) times daily. 12/29/16 03/07/17 Yes Doles-Johnson, Teah, NP  UNABLE TO FIND Vismed (0.18% Sodium Hyaluronate): Instill 1 drop into both eyes three times a day   Yes [provider]   Review of Systems  Constitutional: Positive for fatigue.  Negative for appetite change (better).  HENT: Negative for congestion, postnasal drip and sore throat.   Eyes: Negative.   Respiratory: Negative for cough, chest tightness and shortness of breath.   Cardiovascular: Negative for chest pain, palpitations and leg swelling.  Gastrointestinal: Negative for abdominal distention and abdominal pain.  Endocrine: Negative.   Genitourinary: Negative.   Musculoskeletal: Positive for back pain (after walking much). Negative for neck pain.  Skin: Negative.   Allergic/Immunologic: Negative.   Neurological: Negative for dizziness and light-headedness.  Hematological: Negative for adenopathy. Does not bruise/bleed easily.  Psychiatric/Behavioral: Negative for dysphoric mood and sleep disturbance (sleeping on 1 pillow). The patient is not nervous/anxious.    Vitals:   03/07/17 1606  BP: (!) 97/53  Pulse: 92  Resp: 18  SpO2: 100%  Weight: 127 lb (57.6 kg)  Height: 5' (1.524 m)   Wt Readings from Last 3 Encounters:  03/07/17 127 lb (57.6 kg)  03/02/17 130 lb 3.2 oz (59.1 kg)  02/02/17 127 lb (57.6 kg)    Lab Results  Component Value Date   CREATININE 1.36 (H) 01/06/2017   CREATININE 1.69 (H) 12/29/2016   CREATININE 1.66 (H) 12/22/2016    Physical Exam  Constitutional: She is oriented to person, place, and time. She appears well-developed and well-nourished.  HENT:  Head: Normocephalic and atraumatic.  Neck: Normal range of motion. Neck supple. No JVD present.  Cardiovascular: Normal rate and regular rhythm.  Pulmonary/Chest: Effort normal. She has no wheezes. She has no rales.  Abdominal: Soft. She exhibits no distension. There is no tenderness.  Musculoskeletal: She exhibits no edema or tenderness.  Neurological: She is alert and oriented to person, place, and time.  Skin: Skin is warm and dry.  Psychiatric: She has a normal mood and affect. Her behavior is normal. Thought content normal.  Nursing note and vitals reviewed.     Assessment & Plan:  1:Chronic heart failure with reduced ejection fraction- - NYHA II - euvolemic today - already weighing daily and she was instructed to call for an overnight weight gain of >2 pounds or a weekly weight gain of >5 pounds - not adding salt to her food. Can use Mrs. Dash for seasoning. Daughter continues to read food labels - limited in medications due to her low BP - saw cardiology [facility] Al[facility](Berge) 01/06/17  2: HTN- - BP on the low side but she is currently without dizziness - BMP from 01/06/17 reviewed and shows sodium 140, potassium 5.3 and GFR 41  3: Diabetes- - A1c on 03/02/17 was 8.0% - follows with [facility] [place] & was last seen 03/02/17  Medication bottles were reviewed.   Entire visit was done with the arabic interpreter present.  Patient returning back to her home country next month so a return appointment was not made. Advised her to call for any questions/problems before then.

## 2017-03-07 ENCOUNTER — Encounter: Payer: Self-pay | Admitting: Family

## 2017-03-07 ENCOUNTER — Ambulatory Visit: Payer: Self-pay | Attending: Family | Admitting: Family

## 2017-03-07 VITALS — BP 97/53 | HR 92 | Resp 18 | Ht 60.0 in | Wt 127.0 lb

## 2017-03-07 DIAGNOSIS — E785 Hyperlipidemia, unspecified: Secondary | ICD-10-CM | POA: Insufficient documentation

## 2017-03-07 DIAGNOSIS — E1122 Type 2 diabetes mellitus with diabetic chronic kidney disease: Secondary | ICD-10-CM

## 2017-03-07 DIAGNOSIS — N183 Chronic kidney disease, stage 3 unspecified: Secondary | ICD-10-CM

## 2017-03-07 DIAGNOSIS — I110 Hypertensive heart disease with heart failure: Secondary | ICD-10-CM | POA: Insufficient documentation

## 2017-03-07 DIAGNOSIS — E119 Type 2 diabetes mellitus without complications: Secondary | ICD-10-CM | POA: Insufficient documentation

## 2017-03-07 DIAGNOSIS — Z794 Long term (current) use of insulin: Secondary | ICD-10-CM | POA: Insufficient documentation

## 2017-03-07 DIAGNOSIS — I5022 Chronic systolic (congestive) heart failure: Secondary | ICD-10-CM

## 2017-03-07 DIAGNOSIS — I509 Heart failure, unspecified: Secondary | ICD-10-CM | POA: Insufficient documentation

## 2017-03-07 DIAGNOSIS — I10 Essential (primary) hypertension: Secondary | ICD-10-CM

## 2017-03-07 DIAGNOSIS — Z7982 Long term (current) use of aspirin: Secondary | ICD-10-CM | POA: Insufficient documentation

## 2017-03-07 DIAGNOSIS — Z79899 Other long term (current) drug therapy: Secondary | ICD-10-CM | POA: Insufficient documentation

## 2017-03-07 NOTE — Patient Instructions (Signed)
Continue weighing daily and call for an overnight weight gain of > 2 pounds or a weekly weight gain of >5 pounds. 

## 2017-03-08 ENCOUNTER — Telehealth: Payer: Self-pay | Admitting: Cardiovascular Disease

## 2017-03-08 ENCOUNTER — Other Ambulatory Visit: Payer: Self-pay

## 2017-03-08 MED ORDER — VALSARTAN 40 MG PO TABS: 40.0000 mg | ORAL_TABLET | Freq: Two times a day (BID) | ORAL | 0 refills | Status: AC

## 2017-03-08 NOTE — Telephone Encounter (Signed)
Pt came into office and states she was out on samples on Entreso   Please advise

## 2017-03-08 NOTE — Telephone Encounter (Signed)
Pt accompanied by another female came into the office asking for Entresto samples. I s/w Rita at [facility] as their organization was to provide patient assistance for this medication. [place] SinkRita informed me that the patient and female just left [facility] 30 minutes prior after picking up valsartan. Allen SinkRita states that Dr. Kirke CorinArida sent a message to change entresto to valsartan 40mg  BID as pt was unable to qualify for entresto assistance.  Informed patient and female of this. The female accompanying patient verbalized understanding.  No entresto samples were provided.  Medication list updated.

## 2017-03-16 ENCOUNTER — Ambulatory Visit: Payer: Self-pay

## 2017-03-21 ENCOUNTER — Telehealth: Payer: Self-pay | Admitting: Cardiovascular Disease

## 2017-03-21 NOTE — Telephone Encounter (Signed)
Pt son came into office stating pt is going out of town   Patient calling the office for samples of medication:   1.  What medication and dosage are you requesting samples for? Entresto  2.  Are you currently out of this medication?  Yes

## 2017-03-21 NOTE — Telephone Encounter (Signed)
Pt's daughter are aware that pt's medications will be handled by medication management.  She is currently to take the valsartan until Feb. Which is when [facility] is to start filling her Entresto. They are aware that we do not have her medication her that they would need to contact [facility].

## 2017-03-23 ENCOUNTER — Ambulatory Visit: Payer: Self-pay

## 2017-04-07 ENCOUNTER — Telehealth: Payer: Self-pay | Admitting: Cardiovascular Disease

## 2017-04-07 NOTE — Telephone Encounter (Signed)
[facility] called to notify that patient is out of the country at this time.  Will follow up after she returns.

## 2017-04-07 NOTE — Telephone Encounter (Signed)
[facility], patient was given a bridge Rx for valsartan 40mg  BID #60 while awaiting Entresto. Beginning February 2019, [facility] will [facility] Entresto 24-26mg  BID to replace valsartan.  Left message at [facility] to confirm patient now taking Entresto.

## 2017-12-13 ENCOUNTER — Telehealth: Payer: Self-pay

## 2017-12-13 NOTE — Telephone Encounter (Signed)
Called pt. Number is out of service.

## 2017-12-13 NOTE — Telephone Encounter (Signed)
-----   Message from Rolm Gala, NP sent at 12/05/2017  2:32 PM EDT ----- Please call and schedule a follow up appointment for Stephanie [name].

## 2018-04-10 IMAGING — CR DG CHEST 2V
2 series · 2 of 2 positions shown · non-contrast
Comparison: 12/04/2016

CLINICAL DATA: Shortness of breath.

EXAM:
CHEST  2 VIEW

[chest lat]
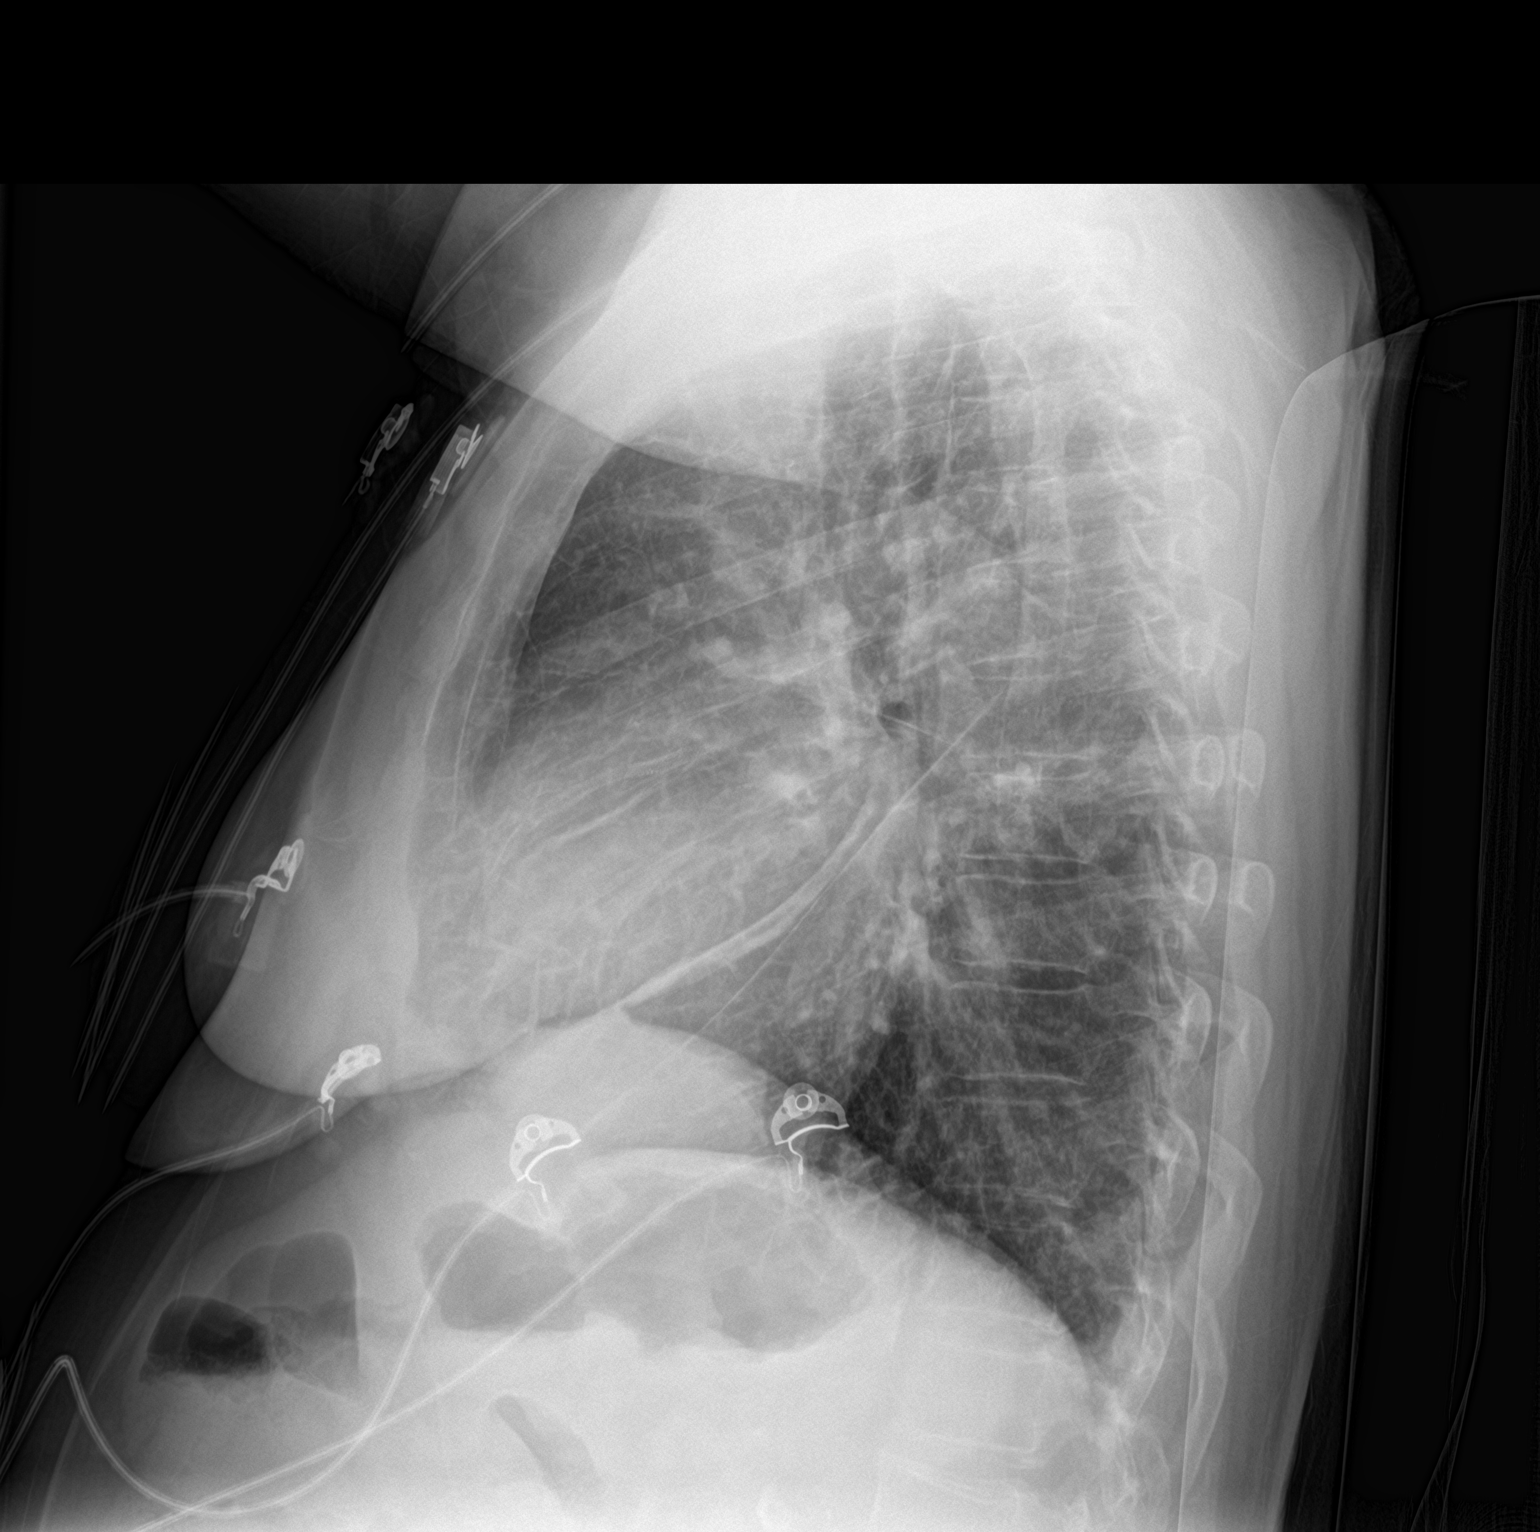

[chest ap]
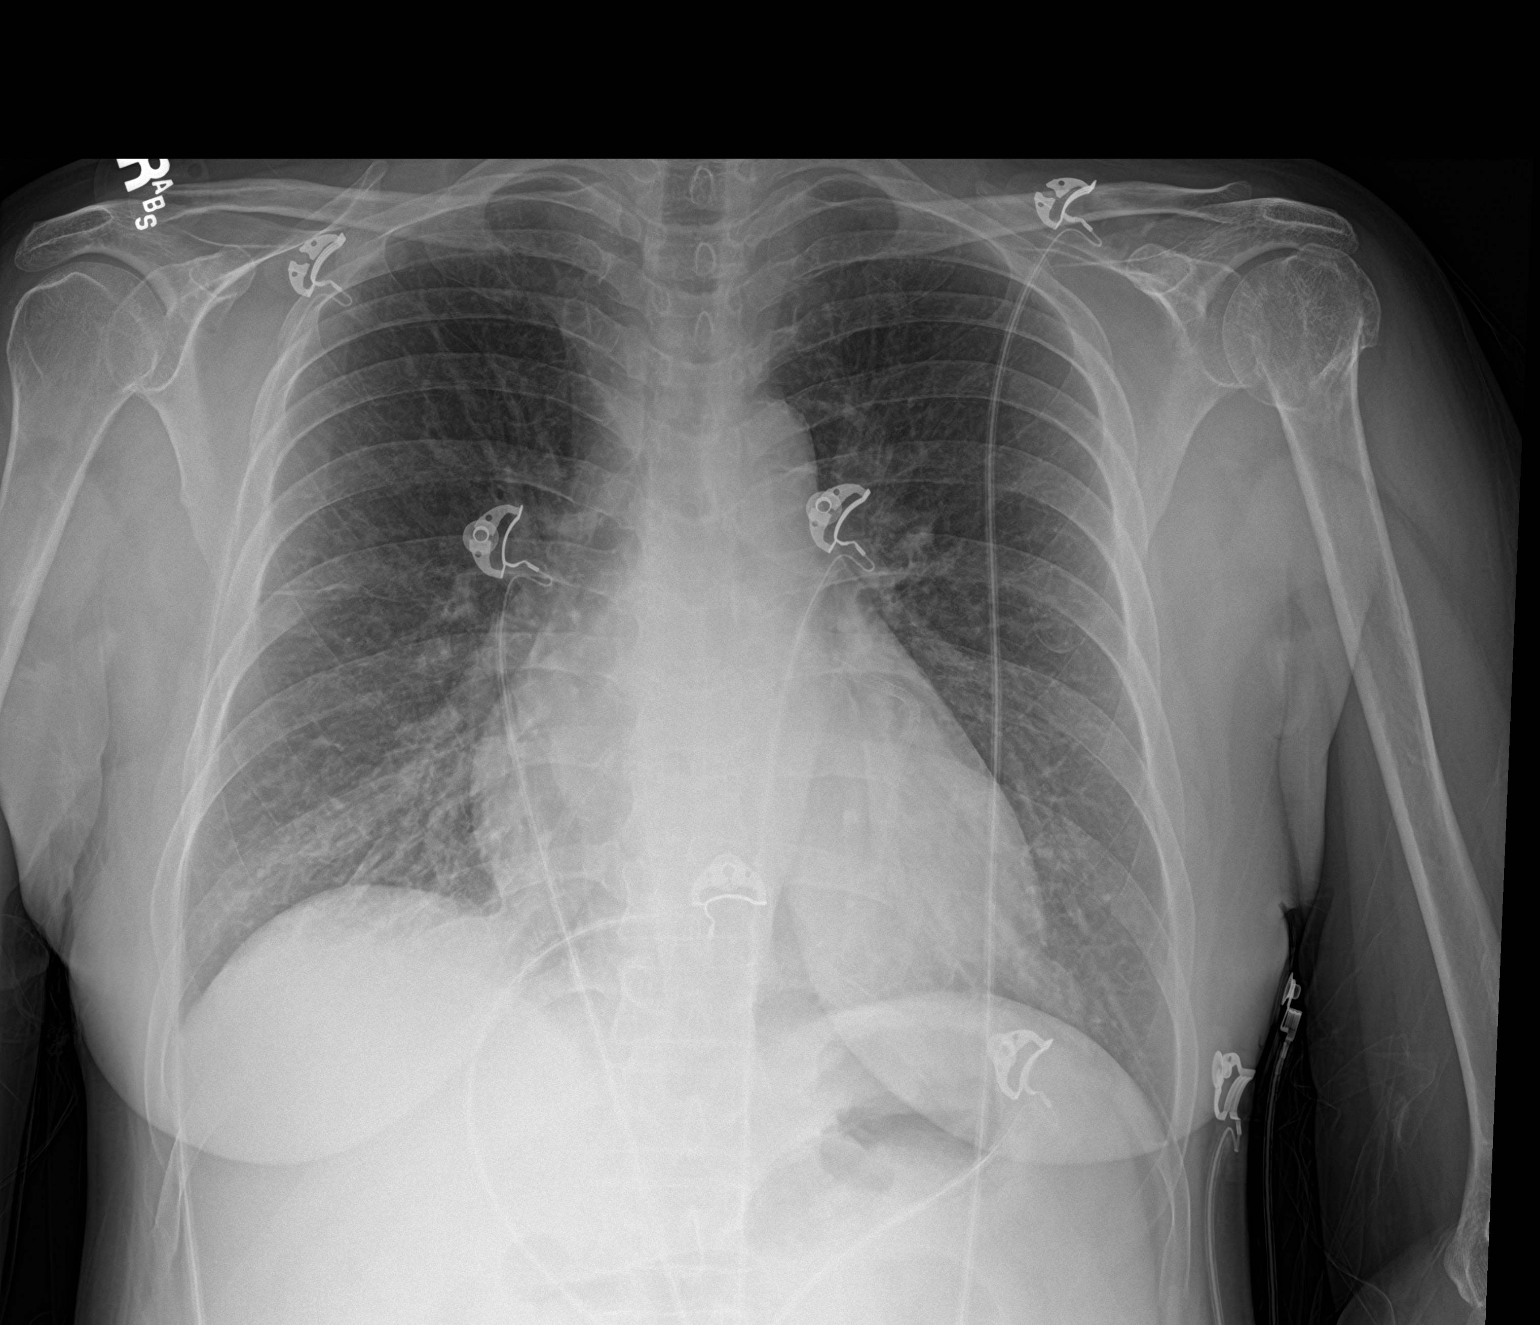

[2 of 2 positions shown; findings below may reference images not displayed]

FINDINGS: The cardiac silhouette remains borderline enlarged. Peribronchial
thickening and streaky perihilar and infrahilar opacities on the
prior study have essentially resolved. No airspace consolidation,
edema, pleural effusion, or pneumothorax is identified. No acute
osseous abnormality is seen.
IMPRESSION: No active cardiopulmonary disease.

## 2018-04-16 IMAGING — DX DG CHEST 1V PORT
1 series · 1 of 1 positions shown · non-contrast
Comparison: 12/14/2016

CLINICAL DATA: Shortness of breath with cough

EXAM:
PORTABLE CHEST 1 VIEW

[chest ap]
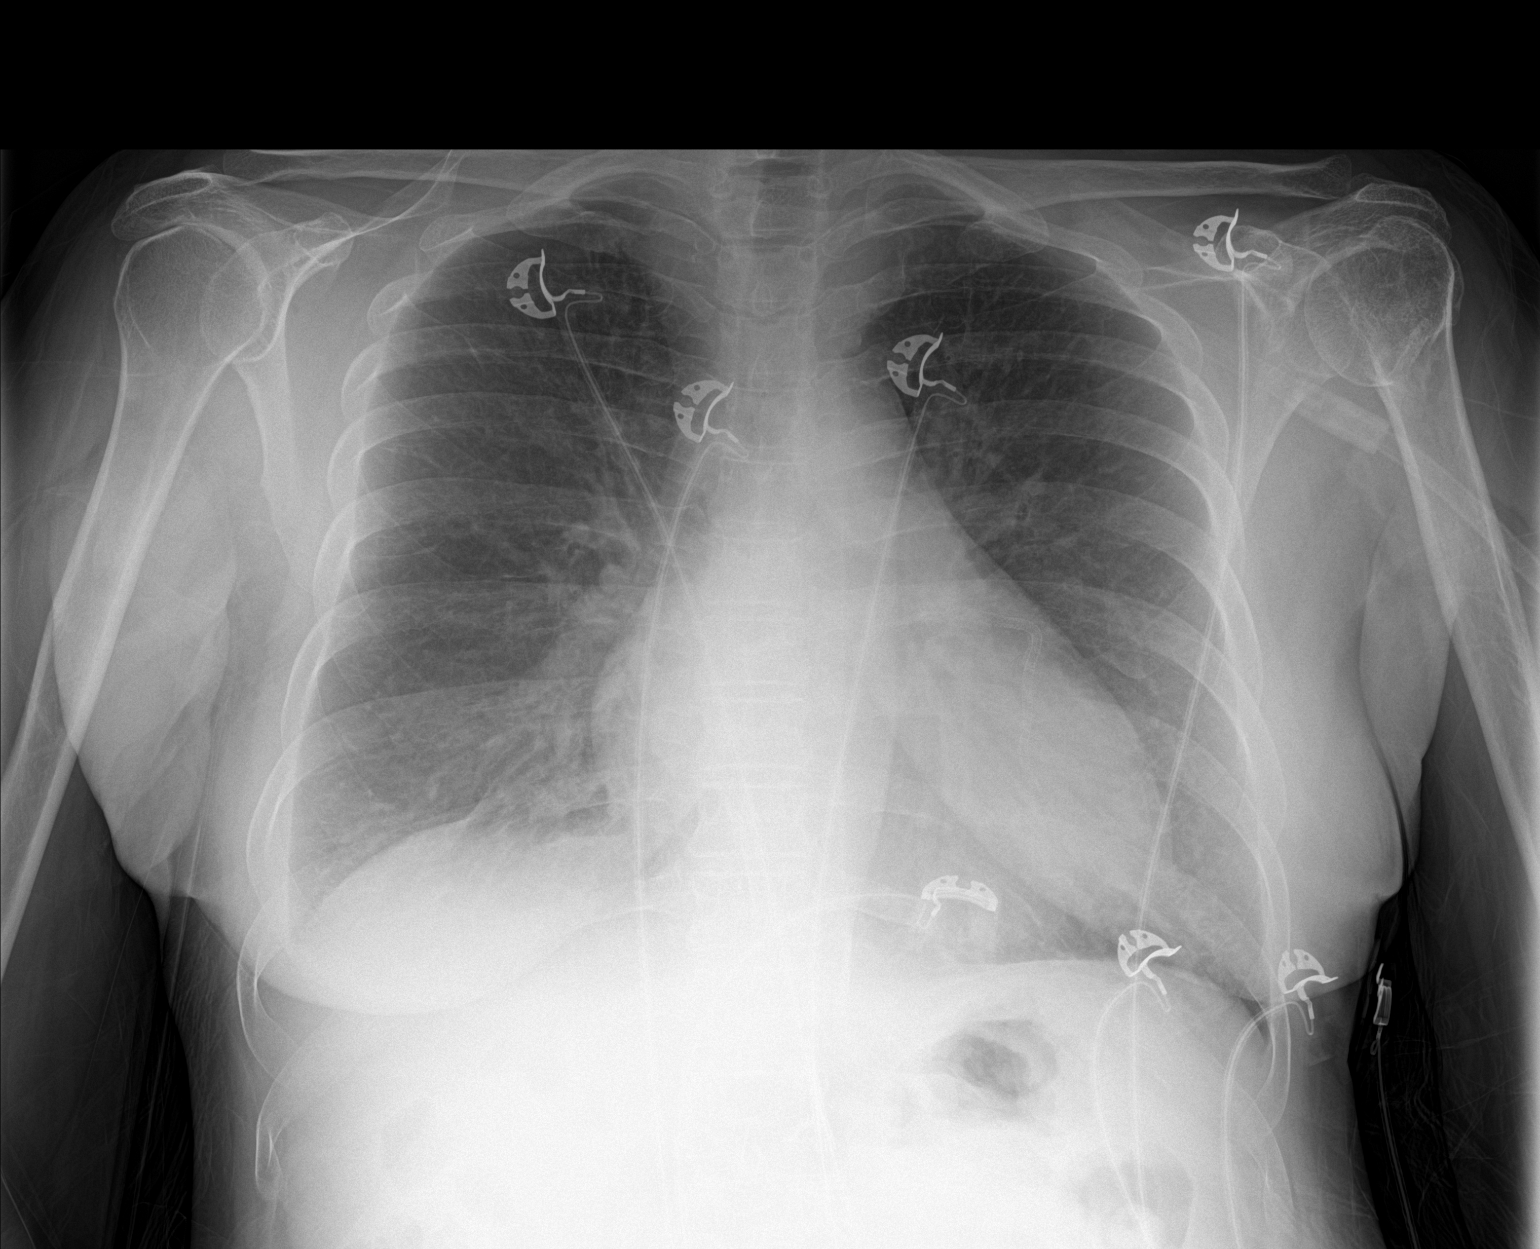

[1 of 1 positions shown; findings below may reference images not displayed]

FINDINGS: Mild cardiomegaly with vascular congestion. Mild diffuse
interstitial prominence suggests mild edema. Streaky atelectasis at
the right base, no change. No pneumothorax.
IMPRESSION: Mild cardiomegaly with slight central congestion. Mild diffuse
interstitial opacity could reflect minimal edema. Streaky
atelectasis at the right base. Small right pleural effusion.
